# Patient Record
Sex: Female | Born: 1995 | Hispanic: No | Marital: Single | Smoking: Never smoker
Health system: Southern US, Community
[De-identification: ages and names within clinical notes are randomized; demographics above are authoritative.]

## PROBLEM LIST (undated history)

## (undated) DIAGNOSIS — L80 Vitiligo: Secondary | ICD-10-CM

## (undated) HISTORY — DX: Vitiligo: L80

---

## 2019-03-20 HISTORY — PX: CYST REMOVAL NECK: SHX6281

## 2021-03-19 NOTE — L&D Delivery Note (Signed)
OB/GYN Faculty Practice Delivery Note  Kimberly Horton is a 27 y.o. G1P0000 s/p SVD at [redacted]w[redacted]d. She was admitted for gHTN.   ROM: 11h 61m with clear fluid GBS Status:  Positive/-- (10/03 1636) Maximum Maternal Temperature:  Temp (24hrs), Avg:97.8 F (36.6 C), Min:97.4 F (36.3 C), Max:98.2 F (36.8 C)    Labor Progress: Patient arrived at 0 cm dilation and was induced with cytotec, pit, arom, FB.   Delivery Date/Time: 12/29/2021 at 0214 Delivery: Called to room and patient was complete and pushing. Head delivered in ROA position. Nuchal cord present and immediately reduced . Shoulder and body delivered in usual fashion. Infant with spontaneous cry, placed on mother's abdomen, dried and stimulated. Cord clamped x 2 after 1-minute delay, and cut by FOB. Cord blood drawn. Placenta delivered spontaneously with gentle cord traction. Fundus firm with massage and Pitocin. Labia, perineum, vagina, and cervix inspected with 1st degree perineal laceration and bilateral hemostatic periurethral lacerations.   Placenta: Spontaneous, intact, 3 vessel cord  Complications: none Lacerations: 1st degree perineal which extends into right labia repaired and bilateral hemostatic periurethral   EBL: 397 Analgesia: epidural    Infant: APGAR (1 MIN): 7   APGAR (5 MINS): 9   APGAR (10 MINS):    Weight: pending  Gifford Shave, MD  12/29/2021 3:14 AM

## 2021-05-29 ENCOUNTER — Other Ambulatory Visit: Payer: Self-pay

## 2021-05-29 ENCOUNTER — Ambulatory Visit (LOCAL_COMMUNITY_HEALTH_CENTER): Payer: Medicaid Other

## 2021-05-29 VITALS — BP 126/74 | Ht 71.0 in | Wt 282.0 lb

## 2021-05-29 DIAGNOSIS — Z3201 Encounter for pregnancy test, result positive: Secondary | ICD-10-CM

## 2021-05-29 LAB — PREGNANCY, URINE: Preg Test, Ur: POSITIVE — AB

## 2021-05-29 MED ORDER — PRENATAL 27-0.8 MG PO TABS: 1.0000 | ORAL_TABLET | Freq: Every day | ORAL | 0 refills | Status: AC

## 2021-05-29 NOTE — Progress Notes (Signed)
UPT positive. Plans prenatal care at [facility]. To clerk for preadmit. Kalii Chesmore, RN  

## 2021-06-07 ENCOUNTER — Encounter: Payer: Self-pay | Admitting: Nurse Practitioner

## 2021-06-07 ENCOUNTER — Ambulatory Visit: Payer: Medicaid Other | Admitting: Advanced Practice Midwife

## 2021-06-07 ENCOUNTER — Other Ambulatory Visit: Payer: Self-pay

## 2021-06-07 DIAGNOSIS — O99212 Obesity complicating pregnancy, second trimester: Secondary | ICD-10-CM

## 2021-06-07 DIAGNOSIS — Z3402 Encounter for supervision of normal first pregnancy, second trimester: Secondary | ICD-10-CM | POA: Diagnosis not present

## 2021-06-07 DIAGNOSIS — L80 Vitiligo: Secondary | ICD-10-CM

## 2021-06-07 DIAGNOSIS — O0930 Supervision of pregnancy with insufficient antenatal care, unspecified trimester: Secondary | ICD-10-CM | POA: Insufficient documentation

## 2021-06-07 DIAGNOSIS — O0932 Supervision of pregnancy with insufficient antenatal care, second trimester: Secondary | ICD-10-CM

## 2021-06-07 DIAGNOSIS — O99210 Obesity complicating pregnancy, unspecified trimester: Secondary | ICD-10-CM

## 2021-06-07 LAB — URINALYSIS
Bilirubin, UA: NEGATIVE
Glucose, UA: NEGATIVE
Ketones, UA: NEGATIVE
Leukocytes,UA: NEGATIVE
Nitrite, UA: NEGATIVE
Protein,UA: NEGATIVE
RBC, UA: NEGATIVE
Specific Gravity, UA: 1.025 (ref 1.005–1.030)
Urobilinogen, Ur: 0.2 mg/dL (ref 0.2–1.0)
pH, UA: 6 (ref 5.0–7.5)

## 2021-06-07 LAB — HEMOGLOBIN, FINGERSTICK: Hemoglobin: 12.8 g/dL (ref 11.1–15.9)

## 2021-06-07 LAB — WET PREP FOR TRICH, YEAST, CLUE
Trichomonas Exam: NEGATIVE
Yeast Exam: NEGATIVE

## 2021-06-07 NOTE — Progress Notes (Addendum)
Patient here for new OB visit at 15 6/7. Patient moved to the area from [facility] last month. Living with FOB and his cousin. She states she and her boyfriend are working and saving for their own apartment. Needs BMI labs today. Needs to be to lab by 2:50 for blood draw. Vaccine records copied and [facility] will be updated. Had Nexplanon removed 10/2020 and planning this pregnancy. States last Pap in [place] fall of 2022, ROI signed and faxed with confirmation. Wants to think about flu vaccine for next visit. Desires Quad screen today. Burt Knack, RN  ? ?Quad screen not done today, due to measurements that don't support current gestational age. Patient aware of situation and counseled that after U/S confirmation of dates, quad screen will be offered.Burt Knack, RN  ?

## 2021-06-07 NOTE — Progress Notes (Signed)
[facility] Department  ?Maternal Health Clinic ? ? ?INITIAL PRENATAL VISIT NOTE ? ?Subjective:  ?Kimberly Horton is a 26 y.o. SBF nonsmoker G1P0000 at [redacted]w[redacted]d being seen today to start prenatal care at the [facility] Department.  She feels "happy" about planned pregnancy when Nexplanon removed 10/2020.  25 yo FOB feels "super excited" about pregnancy; in supportive 1 1/2 year relationship with employed FOB who has no children. She moved here from [facility] 05/12/21 and living with FOB, his cousin and her husband live upstairs. She works 66 hrs/wk at [employer] with FOB. LMP 02/16/21.  Denies cigs, vaping, cigars. Last MJ 2019. Last ETOH 05/03/21 (1 mixed drink) q 2 mo.  Denies ER use or u/s this pregnancy.  She is currently monitored for the following issues for this high-risk pregnancy and has Obesity affecting pregnancy BMI=39.7; Late prenatal care @ 16 1/7; Encounter for supervision of normal first pregnancy in second trimester; and Vitiligo on their problem list. ? ?Patient reports no complaints.  Contractions: Not present. Vag. Bleeding: None.  Movement: Absent. Denies leaking of fluid.  ? ?Indications for ASA therapy (per uptodate) ?One of the following: ?Previous pregnancy with preeclampsia, especially early onset and with an adverse outcome No ?Multifetal gestation No ?Chronic hypertension No ?Type 1 or 2 diabetes mellitus No ?Chronic kidney disease No ?Autoimmune disease (antiphospholipid syndrome, systemic lupus erythematosus) No ? ?Two or more of the following: ?Nulliparity Yes ?Obesity (body mass index >30 kg/m2) Yes ?Family history of preeclampsia in mother or sister No ?Age ?35 years No ?Sociodemographic characteristics (African American race, low socioeconomic level) Yes ?Personal risk factors (eg, previous pregnancy with low birth weight or small for gestational age infant, previous adverse pregnancy outcome [eg, stillbirth], interval >10 years between pregnancies) No ? ? ?The following  portions of the patient's history were reviewed and updated as appropriate: allergies, current medications, past family history, past medical history, past social history, past surgical history and problem list. Problem list updated. ? ?Objective:  ? ?Vitals:  ? 06/07/21 1336  ?BP: 116/78  ?Pulse: 94  ?Temp: 97.7 ?F (36.5 ?C)  ?Weight: 277 lb (125.6 kg)  ? ? ?Fetal Status: Fetal Heart Rate (bpm): not heard Fundal Height: 13 cm Movement: Absent  Presentation: Undeterminable ? ? ?Physical Exam ?Vitals and nursing note reviewed.  ?Constitutional:   ?   General: She is not in acute distress. ?   Appearance: Normal appearance. She is well-developed. She is obese.  ?HENT:  ?   Head: Normocephalic and atraumatic.  ?   Right Ear: External ear normal.  ?   Left Ear: External ear normal.  ?   Nose: Nose normal. No congestion or rhinorrhea.  ?   Mouth/Throat:  ?   Lips: Pink.  ?   Mouth: Mucous membranes are moist.  ?   Dentition: Normal dentition. No dental caries.  ?   Pharynx: Oropharynx is clear. Uvula midline.  ?   Comments: Dentition: good, last dental exam 2022 ?Eyes:  ?   General: No scleral icterus. ?   Conjunctiva/sclera: Conjunctivae normal.  ?Neck:  ?   Thyroid: No thyroid mass or thyromegaly.  ?Cardiovascular:  ?   Rate and Rhythm: Normal rate.  ?   Pulses: Normal pulses.  ?   Comments: Extremities are warm and well perfused ?Pulmonary:  ?   Effort: Pulmonary effort is normal.  ?   Breath sounds: Normal breath sounds.  ?Chest:  ?   Chest wall: No mass.  ?Breasts: ?  Tanner Score is 5.  ?   Breasts are symmetrical.  ?   Right: Normal. No mass, nipple discharge or skin change.  ?   Left: Normal. No mass, nipple discharge or skin change.  ?Abdominal:  ?   Palpations: Abdomen is soft.  ?   Tenderness: There is no abdominal tenderness.  ?   Comments: Gravid, soft without masses or tenderness, increased adipose, fundal height=13 wks size, no FHR heard  ?Genitourinary: ?   General: Normal vulva.  ?   Exam position:  Lithotomy position.  ?   Pubic Area: No rash.   ?   Labia:     ?   Right: No rash.     ?   Left: No rash.   ?   Vagina: Vaginal discharge (white creamy luekorrhea, ph<4.5) present.  ?   Cervix: Normal.  ?   Uterus: Normal. Enlarged (Gravid 13 wk size but difficult to assess due to increased adipose tissue). Not tender.   ?   Rectum: Normal. No external hemorrhoid.  ?Musculoskeletal:  ?   Right lower leg: No edema.  ?   Left lower leg: No edema.  ?Lymphadenopathy:  ?   Cervical: No cervical adenopathy.  ?   Upper Body:  ?   Right upper body: No axillary adenopathy.  ?   Left upper body: No axillary adenopathy.  ?Skin: ?   General: Skin is warm.  ?   Capillary Refill: Capillary refill takes less than 2 seconds.  ?Neurological:  ?   Mental Status: She is alert.  ? ? ?Assessment and Plan:  ?Pregnancy: G1P0000 at [redacted]w[redacted]d ? ?1. Obesity affecting pregnancy, antepartum ?Too late in care to initiate ASA 81 mg daily ? ?2. Late prenatal care @ 16 1/7 ? ? ?3. Encounter for supervision of normal first pregnancy in second trimester ?Counseled on weight gain of 11-20 lbs this pregnancy ?U/s ordered for viability ?Pt wants Quad screen but S<D so will wait until viability/dating u/s done for accuracy ?Early glucola today ?Please give dental list to pt ?- Hgb A1c w/o eAG ?- TSH + free T4 ?- [id number] Drug Screen ?- Protein / creatinine ratio, urine  (Spot) ?- Hemoglobin, venipuncture ?- Urinalysis (Urine Dip) ?- WET PREP FOR TRICH, YEAST, CLUE ?- Comprehensive metabolic panel ?- Glucose tolerance, 1 hour ?- Chlamydia/GC NAA, Confirmation ?- Hgb Fractionation Cascade ?- HCV Ab w Reflex to Quant PCR ?- HIV-1/HIV-2 Qualitative RNA ?- Urine Culture ?- Prenatal profile without Varicella or Rubella ?- Pap IG (Image Guided) ?- [place] MFM OB COMPLETE LESS THAN 14 WEEKS; Future ? ?4. Vitiligo ? ? ? ? ?Discussed overview of care and coordination with inpatient delivery practices including [facility], [facility], [facility] and [facility].  ? ?Reviewed  Centering pregnancy as standard of care at [facility] ? ? ?Preterm labor symptoms and general obstetric precautions including but not limited to vaginal bleeding, contractions, leaking of fluid and fetal movement were reviewed in detail with the patient. ? ?Please refer to After Visit Summary for other counseling recommendations.  ? ?No follow-ups on file. ? ?No future appointments. ? ?Herbie Saxon, CNM ? ?

## 2021-06-07 NOTE — Progress Notes (Signed)
In house labs reviewed with provider, E. Sciora, no treatment indicated.Burt Knack, RN  ?

## 2021-06-08 ENCOUNTER — Other Ambulatory Visit: Payer: Self-pay | Admitting: Advanced Practice Midwife

## 2021-06-08 DIAGNOSIS — Z3402 Encounter for supervision of normal first pregnancy, second trimester: Secondary | ICD-10-CM

## 2021-06-08 LAB — TSH+FREE T4
Free T4: 1.15 ng/dL (ref 0.82–1.77)
TSH: 1.63 u[IU]/mL (ref 0.450–4.500)

## 2021-06-08 LAB — 789231 7+OXYCODONE-BUND
Amphetamines, Urine: NEGATIVE ng/mL
BENZODIAZ UR QL: NEGATIVE ng/mL
Barbiturate screen, urine: NEGATIVE ng/mL
Cannabinoid Quant, Ur: NEGATIVE ng/mL
Cocaine (Metab.): NEGATIVE ng/mL
OPIATE SCREEN URINE: NEGATIVE ng/mL
Oxycodone/Oxymorphone, Urine: NEGATIVE ng/mL
PCP Quant, Ur: NEGATIVE ng/mL

## 2021-06-08 LAB — CBC/D/PLT+RPR+RH+ABO+AB SCR
Antibody Screen: NEGATIVE
Basophils Absolute: 0.1 10*3/uL (ref 0.0–0.2)
Basos: 1 %
EOS (ABSOLUTE): 0.3 10*3/uL (ref 0.0–0.4)
Eos: 2 %
Hematocrit: 37.9 % (ref 34.0–46.6)
Hemoglobin: 12.5 g/dL (ref 11.1–15.9)
Hepatitis B Surface Ag: NEGATIVE
Immature Grans (Abs): 0 10*3/uL (ref 0.0–0.1)
Immature Granulocytes: 0 %
Lymphocytes Absolute: 2.9 10*3/uL (ref 0.7–3.1)
Lymphs: 27 %
MCH: 28.9 pg (ref 26.6–33.0)
MCHC: 33 g/dL (ref 31.5–35.7)
MCV: 88 fL (ref 79–97)
Monocytes Absolute: 0.6 10*3/uL (ref 0.1–0.9)
Monocytes: 6 %
Neutrophils Absolute: 6.7 10*3/uL (ref 1.4–7.0)
Neutrophils: 64 %
Platelets: 407 10*3/uL (ref 150–450)
RBC: 4.33 x10E6/uL (ref 3.77–5.28)
RDW: 13.9 % (ref 11.7–15.4)
RPR Ser Ql: NONREACTIVE
Rh Factor: POSITIVE
WBC: 10.6 10*3/uL (ref 3.4–10.8)

## 2021-06-08 LAB — HCV INTERPRETATION

## 2021-06-08 LAB — PROTEIN / CREATININE RATIO, URINE
Creatinine, Urine: 96.4 mg/dL
Protein, Ur: 5.2 mg/dL
Protein/Creat Ratio: 54 mg/g creat (ref 0–200)

## 2021-06-08 LAB — HGB A1C W/O EAG: Hgb A1c MFr Bld: 5.4 % (ref 4.8–5.6)

## 2021-06-08 LAB — HCV AB W REFLEX TO QUANT PCR: HCV Ab: NONREACTIVE

## 2021-06-09 ENCOUNTER — Other Ambulatory Visit: Payer: Self-pay | Admitting: Family Medicine

## 2021-06-09 ENCOUNTER — Telehealth: Payer: Self-pay

## 2021-06-09 DIAGNOSIS — O0930 Supervision of pregnancy with insufficient antenatal care, unspecified trimester: Secondary | ICD-10-CM

## 2021-06-09 DIAGNOSIS — O99211 Obesity complicating pregnancy, first trimester: Secondary | ICD-10-CM

## 2021-06-09 DIAGNOSIS — Z3402 Encounter for supervision of normal first pregnancy, second trimester: Secondary | ICD-10-CM

## 2021-06-09 LAB — CHLAMYDIA/GC NAA, CONFIRMATION
Chlamydia trachomatis, NAA: NEGATIVE
Neisseria gonorrhoeae, NAA: NEGATIVE

## 2021-06-09 LAB — URINE CULTURE

## 2021-06-09 NOTE — Telephone Encounter (Signed)
TC to patient to inform of 06/14/21 U/S at [facility] in [place]. Patient needs to arrive by 0745. Patient states she has seen the appointment with address in MyChart. Patient states understanding of where to go and when. Denies questions at this time.Burt Knack, RN  ?

## 2021-06-09 NOTE — Progress Notes (Signed)
Talked to [facility] and then Dr. Tama High.  Approved for overbook ? ? ?Caren Macadam, MD, MPH, ABFM ?Medical Director  ?[place] Department ? ?

## 2021-06-11 LAB — PAP IG (IMAGE GUIDED): PAP Smear Comment: 0

## 2021-06-12 LAB — COMPREHENSIVE METABOLIC PANEL
ALT: 13 IU/L (ref 0–32)
AST: 15 IU/L (ref 0–40)
Albumin/Globulin Ratio: 1.4 (ref 1.2–2.2)
Albumin: 4.1 g/dL (ref 3.9–5.0)
Alkaline Phosphatase: 60 IU/L (ref 44–121)
BUN/Creatinine Ratio: 18 (ref 9–23)
BUN: 11 mg/dL (ref 6–20)
Bilirubin Total: <0.2 mg/dL (ref 0.0–1.2)
CO2: 19 mmol/L — ABNORMAL LOW (ref 20–29)
Calcium: 9.2 mg/dL (ref 8.7–10.2)
Chloride: 102 mmol/L (ref 96–106)
Creatinine, Ser: 0.62 mg/dL (ref 0.57–1.00)
Globulin, Total: 3 g/dL (ref 1.5–4.5)
Glucose: 85 mg/dL (ref 70–99)
Potassium: 4.2 mmol/L (ref 3.5–5.2)
Sodium: 134 mmol/L (ref 134–144)
Total Protein: 7.1 g/dL (ref 6.0–8.5)
eGFR: 127 mL/min/{1.73_m2} (ref >59)

## 2021-06-12 LAB — HGB FRACTIONATION CASCADE
Hgb A2: 2.5 % (ref 1.8–3.2)
Hgb A: 97.5 % (ref 96.4–98.8)
Hgb F: 0 % (ref 0.0–2.0)
Hgb S: 0 %

## 2021-06-12 LAB — HIV-1/HIV-2 QUALITATIVE RNA
HIV-1 RNA, Qualitative: NONREACTIVE
HIV-2 RNA, Qualitative: NONREACTIVE

## 2021-06-12 LAB — GLUCOSE, 1 HOUR GESTATIONAL: Gestational Diabetes Screen: 93 mg/dL (ref 70–139)

## 2021-06-13 ENCOUNTER — Other Ambulatory Visit: Payer: Self-pay

## 2021-06-13 ENCOUNTER — Other Ambulatory Visit: Payer: Self-pay | Admitting: *Deleted

## 2021-06-13 ENCOUNTER — Other Ambulatory Visit (HOSPITAL_COMMUNITY): Payer: Self-pay

## 2021-06-13 ENCOUNTER — Other Ambulatory Visit: Payer: Self-pay | Admitting: Family Medicine

## 2021-06-13 ENCOUNTER — Ambulatory Visit: Payer: Medicaid Other | Attending: Family Medicine

## 2021-06-13 DIAGNOSIS — O0930 Supervision of pregnancy with insufficient antenatal care, unspecified trimester: Secondary | ICD-10-CM

## 2021-06-13 DIAGNOSIS — Z3A09 9 weeks gestation of pregnancy: Secondary | ICD-10-CM | POA: Insufficient documentation

## 2021-06-13 DIAGNOSIS — O99211 Obesity complicating pregnancy, first trimester: Secondary | ICD-10-CM | POA: Diagnosis not present

## 2021-06-13 DIAGNOSIS — O26841 Uterine size-date discrepancy, first trimester: Secondary | ICD-10-CM | POA: Diagnosis present

## 2021-06-13 DIAGNOSIS — Z3402 Encounter for supervision of normal first pregnancy, second trimester: Secondary | ICD-10-CM | POA: Insufficient documentation

## 2021-06-13 DIAGNOSIS — Z363 Encounter for antenatal screening for malformations: Secondary | ICD-10-CM

## 2021-06-14 ENCOUNTER — Encounter: Payer: Self-pay | Admitting: Family Medicine

## 2021-06-14 ENCOUNTER — Ambulatory Visit: Payer: Self-pay

## 2021-06-28 NOTE — Addendum Note (Signed)
Addended by: Estiven Kohan on: 06/28/2021 10:37 AM ? ? Modules accepted: Orders ? ?

## 2021-07-03 ENCOUNTER — Encounter: Payer: Self-pay | Admitting: Nurse Practitioner

## 2021-07-03 ENCOUNTER — Ambulatory Visit: Payer: Medicaid Other | Admitting: Nurse Practitioner

## 2021-07-03 VITALS — BP 123/83 | HR 109 | Temp 97.9°F | Wt 273.6 lb

## 2021-07-03 DIAGNOSIS — O99212 Obesity complicating pregnancy, second trimester: Secondary | ICD-10-CM

## 2021-07-03 DIAGNOSIS — Z3402 Encounter for supervision of normal first pregnancy, second trimester: Secondary | ICD-10-CM

## 2021-07-03 DIAGNOSIS — O99210 Obesity complicating pregnancy, unspecified trimester: Secondary | ICD-10-CM

## 2021-07-03 NOTE — Progress Notes (Signed)
[facility] Department ?Maternal Health Clinic ? ?PRENATAL VISIT NOTE ? ?Subjective:  ?Kimberly Horton is a 26 y.o. G1P0000 at [redacted]w[redacted]d being seen today for ongoing prenatal care.  She is currently monitored for the following issues for this high-risk pregnancy and has Obesity affecting pregnancy BMI=39.7; Late prenatal care @ 16 1/7; Encounter for supervision of normal first pregnancy in second trimester; and Vitiligo at age 78 on their problem list. ? ?Patient reports  back pain, sharp pains to right buttocks, and some slight cramping in lower abdomen .  Contractions: Not present. Vag. Bleeding: None.  Movement: Absent. Denies leaking of fluid/ROM.  ? ?The following portions of the patient's history were reviewed and updated as appropriate: allergies, current medications, past family history, past medical history, past social history, past surgical history and problem list. Problem list updated. ? ?Objective:  ? ?Vitals:  ? 07/03/21 0835 07/03/21 0850  ?BP: 126/80 123/83  ?Pulse: (!) 110 (!) 109  ?Temp: 97.9 ?F (36.6 ?C)   ?Weight: 273 lb 9.6 oz (124.1 kg)   ? ? ?Fetal Status: Fetal Heart Rate (bpm): not heard  Fundal Height: 11 cm Movement: Absent    ? ?General:  Alert, oriented and cooperative. Patient is in no acute distress.  ?Skin: Skin is warm and dry. No rash noted.   ?Cardiovascular: Normal heart rate noted  ?Respiratory: Normal respiratory effort, no problems with respiration noted  ?Abdomen: Soft, gravid, appropriate for gestational age.  Pain/Pressure: Present     ?Pelvic: Cervical exam performed        ?Extremities: Normal range of motion.  Edema: None  ?Mental Status: Normal mood and affect. Normal behavior. Normal judgment and thought content.  ? ?Assessment and Plan:  ?Pregnancy: G1P0000 at [redacted]w[redacted]d ? ?1. Encounter for supervision of normal first pregnancy in second trimester ?-26 year old female in clinic today for prenatal visit. ?-Patient taking PNV daily.  ?-Patient reports some sharp pain  in buttocks, slight cramping in lower abdomen, and lower back pain that is not consistent.  Denies urgency, frequency when she urinates, and cloudy or foul smell odor.  Denies vaginal bleeding.  Will continue to monitor.  Patient given stretching exercises to assist with nerve pain in buttocks and lower back pain.   ?-Today patient is [redacted]w[redacted]d.  No fetal heart tones heard today.  Patient had U/S on 06/13/21 ([redacted]w[redacted]d) that showed good fetal movement and heart tones.  CRL 24.1 mm, EDD 01/16/2021.  Due to obesity, recommended for patient to have an anatomy scan at 19 weeks.  Will hopefully hear fetal heart tones at next visit.   ? ?2. Obesity affecting pregnancy, antepartum ?-Patient had U/S on 06/13/21 that changed dating and gestation age.  Patient today is [redacted]w[redacted]d.  Patient advised to take ASA daily.  Patient also encouraged frequent exercise and limiting fatty foods and limiting sugars.   ?--11 lb 6.4 oz (-5.171 kg)  ? ? ?Term labor symptoms and general obstetric precautions including but not limited to vaginal bleeding, contractions, leaking of fluid and fetal movement were reviewed in detail with the patient. ?Please refer to After Visit Summary for other counseling recommendations.  ? ?Return in about 4 weeks (around 07/31/2021) for Routine prenatal care visit. ? ?Future Appointments  ?Date Time Provider [place]  ?08/01/2021  8:40 AM [facility] PROVIDER [facility] None  ?08/22/2021  7:30 AM [facility] NURSE [facility]  ?08/22/2021  7:45 AM [facility] US4 [facility]  ? ? ?Gregary Cromer, FNP ? ?

## 2021-07-03 NOTE — Progress Notes (Signed)
Patient here for MH RV at 11 6/7. Due date changed due to U/S. Aware of anatomy U/S on 08/22/21. Covid card copied. Declines flu vaccine..Ms..Kathie Posa Babs Sciara, RN  ? ?

## 2021-08-01 ENCOUNTER — Ambulatory Visit: Payer: Medicaid Other | Admitting: Family Medicine

## 2021-08-01 VITALS — BP 120/73 | HR 111 | Temp 97.3°F | Wt 277.4 lb

## 2021-08-01 DIAGNOSIS — Z3402 Encounter for supervision of normal first pregnancy, second trimester: Secondary | ICD-10-CM

## 2021-08-01 DIAGNOSIS — O99212 Obesity complicating pregnancy, second trimester: Secondary | ICD-10-CM

## 2021-08-01 NOTE — Progress Notes (Signed)
Client aware of 08/22/2021 [place] appt at [facility] in [place]. Desires MaterniT21 today and AFP only. Client has not taken Aspirin this pregnancy. Jossie Ng, RN ? ?

## 2021-08-01 NOTE — Progress Notes (Signed)
[facility] Department ?Maternal Health Clinic ? ?PRENATAL VISIT NOTE ? ?Subjective:  ?Kimberly Horton is a 25 y.o. G1P0000 at [redacted]w[redacted]d being seen today for ongoing prenatal care.  She is currently monitored for the following issues for this high-risk pregnancy and has Obesity affecting pregnancy BMI=39.7; Late prenatal care @ 16 1/7; Encounter for supervision of normal first pregnancy in second trimester; and Vitiligo at age 21 on their problem list. ? ?Patient reports backache and flank pain  .  Contractions: Not present. Vag. Bleeding: None.  Movement: Absent. Denies leaking of fluid/ROM.  ? ?The following portions of the patient's history were reviewed and updated as appropriate: allergies, current medications, past family history, past medical history, past social history, past surgical history and problem list. Problem list updated. ? ?Objective:  ? ?Vitals:  ? 08/01/21 0841  ?BP: 120/73  ?Pulse: (!) 111  ?Temp: (!) 97.3 ?F (36.3 ?C)  ?Weight: 277 lb 6.4 oz (125.8 kg)  ? ? ?Fetal Status: Fetal Heart Rate (bpm): 159 Fundal Height: 16 cm Movement: Absent    ? ?General:  Alert, oriented and cooperative. Patient is in no acute distress.  ?Skin: Skin is warm and dry. No rash noted.   ?Cardiovascular: Normal heart rate noted  ?Respiratory: Normal respiratory effort, no problems with respiration noted  ?Abdomen: Soft, gravid, appropriate for gestational age.  Pain/Pressure: Present     ?Pelvic: Cervical exam deferred        ?Extremities: Normal range of motion.  Edema: None  ?Mental Status: Normal mood and affect. Normal behavior. Normal judgment and thought content.  ? ?Assessment and Plan:  ?Pregnancy: G1P0000 at [redacted]w[redacted]d ? ?1. Encounter for supervision of normal first pregnancy in second trimester ?Taking PNV as directed  ?Not taking ASA as directed,  "mother told her  no one else she knew that had a baby in the last year was taking ASA.  Encourage pt to take ASA d/t obesity and to help prevent pre-eclampsia.   Information sheet given.   ?FHT heard today 159  ?Reports cramping discussed ligament pain.   ?Aware of anatomy US appointment on 08/22/21 @ 7:30  ? ? ? ?2. Obesity affecting pregnancy in second trimester ?TWG-  ?Discussed diet and exercise - encouraged to have protein at every meal and to do moderate walking to increase heart rate ~ 3-4 times per week.  ?Assessed previous back pain -reports sciatica pain is better, encouraged stretching exercises for sciatica  ? ? ?Preterm labor symptoms and general obstetric precautions including but not limited to vaginal bleeding, contractions, leaking of fluid and fetal movement were reviewed in detail with the patient. ?Please refer to After Visit Summary for other counseling recommendations.  ?No follow-ups on file. ? ?Future Appointments  ?Date Time Provider [place]  ?08/22/2021  7:30 AM [facility] NURSE [facility]  ?08/22/2021  7:45 AM [facility] US4 [facility]  ? ? ?Junious Dresser, FNP ?

## 2021-08-03 LAB — AFP, SERUM, OPEN SPINA BIFIDA
AFP MoM: 1.2
AFP Value: 29.1 ng/mL
Gest. Age on Collection Date: 16 weeks
Maternal Age At EDD: 24.8 yr
OSBR Risk 1 IN: 6499
Test Results:: NEGATIVE
Weight: 277 [lb_av]

## 2021-08-10 LAB — MATERNIT 21 PLUS CORE, BLOOD
Fetal Fraction: 7
Result (T21): NEGATIVE
Trisomy 13 (Patau syndrome): NEGATIVE
Trisomy 18 (Edwards syndrome): NEGATIVE
Trisomy 21 (Down syndrome): NEGATIVE

## 2021-08-22 ENCOUNTER — Ambulatory Visit: Payer: Medicaid Other | Attending: Obstetrics

## 2021-08-22 ENCOUNTER — Ambulatory Visit: Payer: Medicaid Other | Admitting: *Deleted

## 2021-08-22 ENCOUNTER — Other Ambulatory Visit: Payer: Self-pay | Admitting: *Deleted

## 2021-08-22 ENCOUNTER — Encounter: Payer: Self-pay | Admitting: *Deleted

## 2021-08-22 VITALS — BP 125/83 | HR 116

## 2021-08-22 DIAGNOSIS — O99212 Obesity complicating pregnancy, second trimester: Secondary | ICD-10-CM

## 2021-08-22 DIAGNOSIS — Z3A19 19 weeks gestation of pregnancy: Secondary | ICD-10-CM | POA: Diagnosis not present

## 2021-08-22 DIAGNOSIS — E669 Obesity, unspecified: Secondary | ICD-10-CM

## 2021-08-22 DIAGNOSIS — Z362 Encounter for other antenatal screening follow-up: Secondary | ICD-10-CM

## 2021-08-22 DIAGNOSIS — Z363 Encounter for antenatal screening for malformations: Secondary | ICD-10-CM | POA: Insufficient documentation

## 2021-08-29 ENCOUNTER — Ambulatory Visit: Payer: Medicaid Other | Admitting: Advanced Practice Midwife

## 2021-08-29 VITALS — BP 125/81 | HR 135 | Temp 97.9°F | Wt 283.6 lb

## 2021-08-29 DIAGNOSIS — O0932 Supervision of pregnancy with insufficient antenatal care, second trimester: Secondary | ICD-10-CM

## 2021-08-29 DIAGNOSIS — Z3402 Encounter for supervision of normal first pregnancy, second trimester: Secondary | ICD-10-CM

## 2021-08-29 DIAGNOSIS — O99212 Obesity complicating pregnancy, second trimester: Secondary | ICD-10-CM

## 2021-08-29 DIAGNOSIS — O0930 Supervision of pregnancy with insufficient antenatal care, unspecified trimester: Secondary | ICD-10-CM

## 2021-08-29 DIAGNOSIS — Z8659 Personal history of other mental and behavioral disorders: Secondary | ICD-10-CM

## 2021-08-29 NOTE — Progress Notes (Signed)
[facility] Health Department Maternal Health Clinic  PRENATAL VISIT NOTE  Subjective:  Kimberly Horton is a 26 y.o. G1P0000 at [redacted]w[redacted]d being seen today for ongoing prenatal care.  She is currently monitored for the following issues for this low-risk pregnancy and has Obesity affecting pregnancy BMI=39.7; Late prenatal care @ 16 1/7; Encounter for supervision of normal first pregnancy in second trimester; and Vitiligo at age 54 on their problem list.  Patient reports no complaints.  Contractions: Not present. Vag. Bleeding: None.  Movement: Present. Denies leaking of fluid/ROM.   The following portions of the patient's history were reviewed and updated as appropriate: allergies, current medications, past family history, past medical history, past social history, past surgical history and problem list. Problem list updated.  Objective:   Vitals:   08/29/21 1003 08/29/21 1017  BP: 125/81   Pulse: (!) 149 (!) 135  Temp: 97.9 F (36.6 C)   Weight: 283 lb 9.6 oz (128.6 kg)     Fetal Status: Fetal Heart Rate (bpm): 140 Fundal Height: 29 cm Movement: Present     General:  Alert, oriented and cooperative. Patient is in no acute distress.  Skin: Skin is warm and dry. No rash noted.   Cardiovascular: Normal heart rate noted  Respiratory: Normal respiratory effort, no problems with respiration noted  Abdomen: Soft, gravid, appropriate for gestational age.  Pain/Pressure: Absent     Pelvic: Cervical exam deferred        Extremities: Normal range of motion.  Edema: None  Mental Status: Normal mood and affect. Normal behavior. Normal judgment and thought content.   Assessment and Plan:  Pregnancy: G1P0000 at [redacted]w[redacted]d  1. Obesity affecting pregnancy in second trimester -1 lb 6.4 oz (-0.635 kg) 6 lb wt gain in past 4 wks Not exercising--encouraged to do so Began taking ASA at 16 wks--discussion with pt that too late for any benefits to be derived  2. Late prenatal care @ 16 1/7   3.  Encounter for supervision of normal first pregnancy in second trimester Reviewed 08/22/21 u/s at 19 2/7 with posterior placenta, AFI wnl, EFW=44%, 3VC, anatomy wnl Has f/u u/s 09/20/21 Gender reveal party in 5 days P=149 and 135 on repeat. Pt states she feels sl out of breath since coming off elevator, denies caffeine or anxiety. Hx panic attacks age 75. BP 125/81, O2 sat=99% Pulse has been trending upwards this pregnancy If Pulse still elevated at next apt may need referral to MFM, cardio [employer] 40 hrs/wk and living with FOB (his family lives upstairs)   Preterm labor symptoms and general obstetric precautions including but not limited to vaginal bleeding, contractions, leaking of fluid and fetal movement were reviewed in detail with the patient. Please refer to After Visit Summary for other counseling recommendations.  No follow-ups on file.  Future Appointments  Date Time Provider Department Center  09/20/2021  3:30 PM [facility] NURSE [facility]  09/20/2021  3:45 PM [facility] US5 [facility]    Alberteen Spindle, CNM

## 2021-08-29 NOTE — Progress Notes (Signed)
Here today for 20. Week MH RV. Taking ASA and PNV QD. Denies ED/hospital visits since last RV. O2 sats 99%. Hal Morales, RN

## 2021-09-08 ENCOUNTER — Telehealth: Payer: Self-pay | Admitting: Family Medicine

## 2021-09-08 ENCOUNTER — Observation Stay
Admission: EM | Admit: 2021-09-08 | Discharge: 2021-09-08 | Disposition: A | Discharge status: Home / Self Care | Payer: Medicaid Other | Attending: Advanced Practice Midwife | Admitting: Advanced Practice Midwife

## 2021-09-08 ENCOUNTER — Other Ambulatory Visit: Payer: Self-pay

## 2021-09-08 ENCOUNTER — Encounter: Payer: Self-pay | Admitting: Obstetrics and Gynecology

## 2021-09-08 DIAGNOSIS — R1031 Right lower quadrant pain: Secondary | ICD-10-CM | POA: Diagnosis not present

## 2021-09-08 DIAGNOSIS — R1032 Left lower quadrant pain: Secondary | ICD-10-CM | POA: Diagnosis not present

## 2021-09-08 DIAGNOSIS — Z3A21 21 weeks gestation of pregnancy: Secondary | ICD-10-CM | POA: Diagnosis not present

## 2021-09-08 DIAGNOSIS — R102 Pelvic and perineal pain: Secondary | ICD-10-CM | POA: Diagnosis not present

## 2021-09-08 DIAGNOSIS — Z7982 Long term (current) use of aspirin: Secondary | ICD-10-CM | POA: Diagnosis not present

## 2021-09-08 DIAGNOSIS — O26892 Other specified pregnancy related conditions, second trimester: Principal | ICD-10-CM | POA: Insufficient documentation

## 2021-09-08 DIAGNOSIS — Z79899 Other long term (current) drug therapy: Secondary | ICD-10-CM | POA: Insufficient documentation

## 2021-09-08 NOTE — Telephone Encounter (Signed)
Pt states that she would like to speak someone in MH to ask some questions about pain that she is having in her pelvic region.

## 2021-09-20 ENCOUNTER — Ambulatory Visit: Payer: Medicaid Other | Attending: Maternal & Fetal Medicine

## 2021-09-20 ENCOUNTER — Ambulatory Visit: Payer: Medicaid Other | Admitting: *Deleted

## 2021-09-20 VITALS — BP 123/74 | HR 93

## 2021-09-20 DIAGNOSIS — Z3A32 32 weeks gestation of pregnancy: Secondary | ICD-10-CM

## 2021-09-20 DIAGNOSIS — O99212 Obesity complicating pregnancy, second trimester: Secondary | ICD-10-CM

## 2021-09-20 DIAGNOSIS — E669 Obesity, unspecified: Secondary | ICD-10-CM | POA: Diagnosis not present

## 2021-09-21 ENCOUNTER — Other Ambulatory Visit: Payer: Self-pay | Admitting: *Deleted

## 2021-09-21 DIAGNOSIS — Z6841 Body Mass Index (BMI) 40.0 and over, adult: Secondary | ICD-10-CM

## 2021-09-21 DIAGNOSIS — O0932 Supervision of pregnancy with insufficient antenatal care, second trimester: Secondary | ICD-10-CM

## 2021-09-26 ENCOUNTER — Telehealth: Payer: Self-pay

## 2021-09-26 ENCOUNTER — Ambulatory Visit: Payer: Medicaid Other | Admitting: Advanced Practice Midwife

## 2021-09-26 VITALS — BP 119/74 | HR 121 | Temp 97.1°F | Wt 287.0 lb

## 2021-09-26 DIAGNOSIS — O0930 Supervision of pregnancy with insufficient antenatal care, unspecified trimester: Secondary | ICD-10-CM

## 2021-09-26 DIAGNOSIS — O99212 Obesity complicating pregnancy, second trimester: Secondary | ICD-10-CM

## 2021-09-26 DIAGNOSIS — Z3402 Encounter for supervision of normal first pregnancy, second trimester: Secondary | ICD-10-CM

## 2021-09-26 DIAGNOSIS — O0932 Supervision of pregnancy with insufficient antenatal care, second trimester: Secondary | ICD-10-CM

## 2021-09-26 LAB — HEMOGLOBIN, FINGERSTICK: Hemoglobin: 12.5 g/dL (ref 11.1–15.9)

## 2021-09-26 NOTE — Progress Notes (Signed)
Patient here for MH RV at 24w 0d.   Patient education given regarding PTL, handout given and questions answered.   [facility] high risk forms filled out.   Patient aware of upcoming appt.   Rx prescription for PNV given to patient from provider - RN made copy of Rx.   Earlyne Iba, RN

## 2021-09-26 NOTE — Progress Notes (Signed)
Hgb reviewed during clinic - no treatment indicated.   Referral for [facility]FM regarding continuous elevated HR faxed with confirmation 09/26/21.  Patient called and she is aware that she will receive phone call with appointment.   Earlyne Iba, RN

## 2021-09-26 NOTE — Telephone Encounter (Signed)
Call to [facility] [place] scheduler and left message regarding MFM consult appt for client. Number to call provided. (MFM consult referral faxed 09/26/2021). Jossie Ng, RN

## 2021-09-26 NOTE — Progress Notes (Signed)
[facility] Health Department Maternal Health Clinic  PRENATAL VISIT NOTE  Subjective:  Kimberly Horton is a 26 y.o. G1P0000 at [redacted]w[redacted]d being seen today for ongoing prenatal care.  She is currently monitored for the following issues for this low-risk pregnancy and has Obesity affecting pregnancy BMI=39.7; Late prenatal care @ 16 1/7; Encounter for supervision of normal first pregnancy in second trimester; Vitiligo at age 67; and History of panic attacks age 34 on their problem list.  Patient reports  increased pulse continuing with SOB .  Contractions: Not present. Vag. Bleeding: None.  Movement: Present. Denies leaking of fluid/ROM.   The following portions of the patient's history were reviewed and updated as appropriate: allergies, current medications, past family history, past medical history, past social history, past surgical history and problem list. Problem list updated.  Objective:   Vitals:   09/26/21 0819  BP: 119/74  Pulse: (!) 121  Temp: (!) 97.1 F (36.2 C)  Weight: 287 lb (130.2 kg)    Fetal Status: Fetal Heart Rate (bpm): 160 Fundal Height: 26 cm Movement: Present     General:  Alert, oriented and cooperative. Patient is in no acute distress.  Skin: Skin is warm and dry. No rash noted.   Cardiovascular: Normal heart rate noted  Respiratory: Normal respiratory effort, no problems with respiration noted  Abdomen: Soft, gravid, appropriate for gestational age.  Pain/Pressure: Absent     Pelvic: Cervical exam deferred        Extremities: Normal range of motion.  Edema: None  Mental Status: Normal mood and affect. Normal behavior. Normal judgment and thought content.   Assessment and Plan:  Pregnancy: G1P0000 at [redacted]w[redacted]d  1. Encounter for supervision of normal first pregnancy in second trimester MFM referral made for elevated Pulse 149, 135, on 08/29/21 and 121 today as well as SOB  Working 40 hrs/wk Here with FOB Reviewed 09/20/21 u/s at 23 5/7 with AFI  wnl, EFW=65%, 3VC, growth wnl; recommend monthly growth u/s due to obesity Next u/s 10/18/21 Popped blood vessel in left eye yesterday without etiology  - Hemoglobin, fingerstick  2. Obesity affecting pregnancy in second trimester 2 lb (0.907 kg) Not exercising Not taking ASA daily as late entry to care  3. Late prenatal care @ 16 1/7    Preterm labor symptoms and general obstetric precautions including but not limited to vaginal bleeding, contractions, leaking of fluid and fetal movement were reviewed in detail with the patient. Please refer to After Visit Summary for other counseling recommendations.  No follow-ups on file.  Future Appointments  Date Time Provider Department Center  10/18/2021  3:15 PM [facility] NURSE [facility]  10/18/2021  3:30 PM [facility] US3 [facility]    Alberteen Spindle, CNM

## 2021-09-27 ENCOUNTER — Other Ambulatory Visit: Payer: Self-pay

## 2021-09-27 ENCOUNTER — Emergency Department: Payer: Medicaid Other

## 2021-09-27 ENCOUNTER — Encounter: Payer: Self-pay | Admitting: Advanced Practice Midwife

## 2021-09-27 ENCOUNTER — Emergency Department
Admission: EM | Admit: 2021-09-27 | Discharge: 2021-09-27 | Disposition: A | Discharge status: Home / Self Care | Payer: Medicaid Other | Attending: Emergency Medicine | Admitting: Emergency Medicine

## 2021-09-27 ENCOUNTER — Telehealth: Payer: Self-pay

## 2021-09-27 DIAGNOSIS — I471 Supraventricular tachycardia, unspecified: Secondary | ICD-10-CM | POA: Insufficient documentation

## 2021-09-27 DIAGNOSIS — Z3A24 24 weeks gestation of pregnancy: Secondary | ICD-10-CM | POA: Insufficient documentation

## 2021-09-27 DIAGNOSIS — O26892 Other specified pregnancy related conditions, second trimester: Secondary | ICD-10-CM | POA: Diagnosis present

## 2021-09-27 DIAGNOSIS — N3090 Cystitis, unspecified without hematuria: Secondary | ICD-10-CM

## 2021-09-27 DIAGNOSIS — Z20822 Contact with and (suspected) exposure to covid-19: Secondary | ICD-10-CM | POA: Insufficient documentation

## 2021-09-27 DIAGNOSIS — R824 Acetonuria: Secondary | ICD-10-CM | POA: Diagnosis not present

## 2021-09-27 DIAGNOSIS — O2312 Infections of bladder in pregnancy, second trimester: Secondary | ICD-10-CM | POA: Diagnosis not present

## 2021-09-27 DIAGNOSIS — R0602 Shortness of breath: Secondary | ICD-10-CM | POA: Diagnosis not present

## 2021-09-27 DIAGNOSIS — D72829 Elevated white blood cell count, unspecified: Secondary | ICD-10-CM | POA: Diagnosis not present

## 2021-09-27 DIAGNOSIS — O99412 Diseases of the circulatory system complicating pregnancy, second trimester: Secondary | ICD-10-CM | POA: Diagnosis not present

## 2021-09-27 DIAGNOSIS — I493 Ventricular premature depolarization: Secondary | ICD-10-CM

## 2021-09-27 LAB — URINALYSIS, ROUTINE W REFLEX MICROSCOPIC
Bilirubin Urine: NEGATIVE
Glucose, UA: NEGATIVE mg/dL
Hgb urine dipstick: NEGATIVE
Ketones, ur: 5 mg/dL — AB
Nitrite: NEGATIVE
Protein, ur: NEGATIVE mg/dL
Specific Gravity, Urine: 1.026 (ref 1.005–1.030)
pH: 6 (ref 5.0–8.0)

## 2021-09-27 LAB — COMPREHENSIVE METABOLIC PANEL
ALT: 17 U/L (ref 0–44)
AST: 19 U/L (ref 15–41)
Albumin: 3 g/dL — ABNORMAL LOW (ref 3.5–5.0)
Alkaline Phosphatase: 57 U/L (ref 38–126)
Anion gap: 6 (ref 5–15)
BUN: 8 mg/dL (ref 6–20)
CO2: 20 mmol/L — ABNORMAL LOW (ref 22–32)
Calcium: 8.8 mg/dL — ABNORMAL LOW (ref 8.9–10.3)
Chloride: 111 mmol/L (ref 98–111)
Creatinine, Ser: 0.62 mg/dL (ref 0.44–1.00)
GFR, Estimated: >60 mL/min (ref >60)
Glucose, Bld: 107 mg/dL — ABNORMAL HIGH (ref 70–99)
Potassium: 4 mmol/L (ref 3.5–5.1)
Sodium: 137 mmol/L (ref 135–145)
Total Bilirubin: 0.3 mg/dL (ref 0.3–1.2)
Total Protein: 6.4 g/dL — ABNORMAL LOW (ref 6.5–8.1)

## 2021-09-27 LAB — CBC WITH DIFFERENTIAL/PLATELET
Abs Immature Granulocytes: 0.09 10*3/uL — ABNORMAL HIGH (ref 0.00–0.07)
Basophils Absolute: 0.1 10*3/uL (ref 0.0–0.1)
Basophils Relative: 0 %
Eosinophils Absolute: 0.3 10*3/uL (ref 0.0–0.5)
Eosinophils Relative: 2 %
HCT: 36.8 % (ref 36.0–46.0)
Hemoglobin: 12.2 g/dL (ref 12.0–15.0)
Immature Granulocytes: 1 %
Lymphocytes Relative: 22 %
Lymphs Abs: 3.2 10*3/uL (ref 0.7–4.0)
MCH: 28.5 pg (ref 26.0–34.0)
MCHC: 33.2 g/dL (ref 30.0–36.0)
MCV: 86 fL (ref 80.0–100.0)
Monocytes Absolute: 0.7 10*3/uL (ref 0.1–1.0)
Monocytes Relative: 5 %
Neutro Abs: 10.4 10*3/uL — ABNORMAL HIGH (ref 1.7–7.7)
Neutrophils Relative %: 70 %
Platelets: 370 10*3/uL (ref 150–400)
RBC: 4.28 MIL/uL (ref 3.87–5.11)
RDW: 13.9 % (ref 11.5–15.5)
WBC: 14.7 10*3/uL — ABNORMAL HIGH (ref 4.0–10.5)
nRBC: 0 % (ref 0.0–0.2)

## 2021-09-27 LAB — TSH: TSH: 4.312 u[IU]/mL (ref 0.350–4.500)

## 2021-09-27 LAB — RESP PANEL BY RT-PCR (FLU A&B, COVID) ARPGX2
Influenza A by PCR: NEGATIVE
Influenza B by PCR: NEGATIVE
SARS Coronavirus 2 by RT PCR: NEGATIVE

## 2021-09-27 LAB — BRAIN NATRIURETIC PEPTIDE: B Natriuretic Peptide: 14.5 pg/mL (ref 0.0–100.0)

## 2021-09-27 LAB — TROPONIN I (HIGH SENSITIVITY)
Troponin I (High Sensitivity): 4 ng/L (ref <18)
Troponin I (High Sensitivity): 4 ng/L (ref <18)

## 2021-09-27 LAB — PROCALCITONIN: Procalcitonin: <0.1 ng/mL

## 2021-09-27 MED ORDER — SODIUM CHLORIDE 0.9 % IV BOLUS
500.0000 mL | Freq: Once | INTRAVENOUS | Status: AC
  Administered 2021-09-27: 500 mL via INTRAVENOUS

## 2021-09-27 MED ORDER — CEPHALEXIN 500 MG PO CAPS: 500.0000 mg | ORAL_CAPSULE | Freq: Four times a day (QID) | ORAL | 0 refills | Status: AC

## 2021-09-27 MED ORDER — IOHEXOL 350 MG/ML SOLN
75.0000 mL | Freq: Once | INTRAVENOUS | Status: AC | PRN
  Administered 2021-09-27: 75 mL via INTRAVENOUS

## 2021-09-27 NOTE — Telephone Encounter (Signed)
Call to [facility] at [facility] to schedule consult (ordered 09/26/2021). Placed on hold and then Elnita Maxwell stated Dr. Grace Bushy wanted to speak with consult ordering provider. Phone taken to Ms. Sciora.   Following phone call, following verbal orders provided to RN by Ms. Sciora CNM: 1) call client and inform needs L & D evaluation today to r/o pulmonary embolism (okay to go to L & D when work ends today at 1400) and 2) notify  L & D that Dr. Grace Bushy wants her to have an EKG today while at L & D. Per Ms. Sciora, she will call [facility] L & D regarding above.  Call to client with above information. Understanding verbalized. Aware, per Ms. Sciora, that can go to L & D when work ends at 1400. Also counseled that work note could be provided if left work early to go to hospital. Questions answered. Jossie Ng, RN

## 2021-09-27 NOTE — ED Notes (Signed)
Patient transported to CT 

## 2021-09-27 NOTE — Telephone Encounter (Signed)
Per Hazle Coca CNM, following phone call to [facility] L & D, they do not rule out for pulmonary embolism in L & D => done in ED. Call to client to update her with above information. Jossie Ng, RN

## 2021-09-27 NOTE — ED Provider Notes (Signed)
[facility] Provider Note    Event Date/Time   First MD Initiated Contact with Patient 09/27/21 1628     (approximate)   History   Shortness of Breath   HPI  Kimberly Horton is a 26 y.o. female who is [redacted] weeks pregnant, no prior complications during pregnancy, who comes in with chest pain, sob.  She reports before pregnancy having some mild issues with chest pain, sob.  She was post to see a cardiologist but never did.  She denies any history of elevated heart rates previously but since being pregnant she has noted that her heart rate is higher than normal.  No history of pre eclampsia. She reports however the sob and chest pain is getting worse with pregnancy. No Leg swelling or pain in lower extremity.  Used to smoke 2.5 years ago. Sating normal on room air.  Patient reports that she was sent here for rule out of PE due to having elevated heart rate.  Patient does report that she has had normal good fetal movements and denies any gush of fluids or issues with the pregnancy.     Physical Exam   Triage Vital Signs: ED Triage Vitals  Enc Vitals Group     BP 09/27/21 1333 138/83     Pulse Rate 09/27/21 1333 (!) 118     Resp 09/27/21 1333 (!) 24     Temp 09/27/21 1333 98.2 F (36.8 C)     Temp Source 09/27/21 1333 Oral     SpO2 09/27/21 1333 98 %     Weight 09/27/21 1333 287 lb 0.6 oz (130.2 kg)     Height 09/27/21 1333 5\' 11"  (1.803 m)     Head Circumference --      Peak Flow --      Pain Score 09/27/21 1332 0     Pain Loc --      Pain Edu? --      Excl. in GC? --     Most recent vital signs: Vitals:   09/27/21 1333 09/27/21 1618  BP: 138/83 130/81  Pulse: (!) 118 (!) 101  Resp: (!) 24 17  Temp: 98.2 F (36.8 C)   SpO2: 98% 98%     General: Awake, no distress.  CV:  Good peripheral perfusion. Tachy  Resp:  Normal effort.  Abd:  No distention.  Other:  No swelling in leg, no calf tenderness.    ED Results / Procedures /  Treatments   Labs (all labs ordered are listed, but only abnormal results are displayed) Labs Reviewed  COMPREHENSIVE METABOLIC PANEL - Abnormal; Notable for the following components:      Result Value   CO2 20 (*)    Glucose, Bld 107 (*)    Calcium 8.8 (*)    Total Protein 6.4 (*)    Albumin 3.0 (*)    All other components within normal limits  CBC WITH DIFFERENTIAL/PLATELET - Abnormal; Notable for the following components:   WBC 14.7 (*)    Neutro Abs 10.4 (*)    Abs Immature Granulocytes 0.09 (*)    All other components within normal limits  RESP PANEL BY RT-PCR (FLU A&B, COVID) ARPGX2  TSH  PROCALCITONIN  TROPONIN I (HIGH SENSITIVITY)  TROPONIN I (HIGH SENSITIVITY)     EKG  My interpretation of EKG:  Sinus tachy rate of 113, no st elevation, t wave in lead 3, normal intervals.   RADIOLOGY I have reviewed the xray personally  and interpreted  no PNA    PROCEDURES:  Critical Care performed: No  .1-3 Lead EKG Interpretation  Performed by: Concha Se, MD Authorized by: Concha Se, MD     Interpretation: abnormal     ECG rate:  110   ECG rate assessment: tachycardic     Rhythm: sinus tachycardia     Ectopy: PVCs     Conduction: normal      MEDICATIONS ORDERED IN ED: Medications  sodium chloride 0.9 % bolus 500 mL (has no administration in time range)     IMPRESSION / MDM / ASSESSMENT AND PLAN / ED COURSE  I reviewed the triage vital signs and the nursing notes.   Patient's presentation is most consistent with acute presentation with potential threat to life or bodily function.   Differential: Anemia, infectious, cardiac, PE  CBC showed elevated white count hemoglobin stable CMP normal  TSH normal  Had extensive conversation with patient about the benefits and risk of CT PE and limited use of D-dimer, ultrasounds.  Patient understands the risk of radiation to the baby and understands the risk of radiation to her breast would like to to  proceed with CT PE  IMPRESSION: Negative. No evidence of pulmonary embolism or other active disease.  Urine with 11-20 WBCs and rare bacteria therefore will be over cautious and start on some Keflex.  Patient positive for mild ketones and given some fluids.  Heart rates have improved.  We will get her cardiology referral due to elevated heart rate and occasional PVCs which she expressed understanding felt comfortable with discharge.  She understands to return if she develops worsening shortness of breath leg swelling or any other concern  Considered admission but given reassuring work-up and resolving tachycardia patient is comfortable with discharge home      The patient is on the cardiac monitor to evaluate for evidence of arrhythmia and/or significant heart rate changes.      FINAL CLINICAL IMPRESSION(S) / ED DIAGNOSES   Final diagnoses:  [redacted] weeks gestation of pregnancy  Shortness of breath  Cystitis  PVC (premature ventricular contraction)     Rx / DC Orders   ED Discharge Orders          Ordered    Ambulatory referral to Cardiology        09/27/21 2024    cephALEXin (KEFLEX) 500 MG capsule  4 times daily        09/27/21 2027             Note:  This document was prepared using Dragon voice recognition software and may include unintentional dictation errors.   Concha Se, MD 09/27/21 2029

## 2021-09-27 NOTE — ED Provider Triage Note (Signed)
Emergency Medicine Provider Triage Evaluation Note  [facility] Kimberly Horton , a 26 y.o. female  was evaluated in triage.  Pt complains of tachycardia and some shortness of breath.  Patient is [redacted] weeks pregnant.  Has had elevated heart rate since the beginning of pregnancy.  States that she is having shortness of breath her doctor wanted to be evaluated in the ED.  Review of Systems  Positive: Tachycardia, shortness of breath Negative: Fever chills  Physical Exam  BP 138/83 (BP Location: Left Arm)   Pulse (!) 118   Temp 98.2 F (36.8 C) (Oral)   Resp (!) 24   Ht 5\' 11"  (1.803 m)   Wt 130.2 kg   LMP 02/16/2021 (Exact Date)   SpO2 98%   BMI 40.03 kg/m  Gen:   Awake, no distress   Resp:  Normal effort  MSK:   Moves extremities without difficulty  Other:    Medical Decision Making  Medically screening exam initiated at 1:44 PM.  Appropriate orders placed.  Kimberly Horton was informed that the remainder of the evaluation will be completed by another provider, this initial triage assessment does not replace that evaluation, and the importance of remaining in the ED until their evaluation is complete.  Patient's heart rate is elevated, fetal heart heart tones were assessed and are normal   Anice Paganini, PA-C 09/27/21 1345

## 2021-09-27 NOTE — ED Triage Notes (Signed)
Pt is [redacted] weeks pregnant, sent from [facility] health department to r/o PE. Pt c/o SOB with elevated HR , states she has been having issues throughout her pregnancy

## 2021-09-27 NOTE — Discharge Instructions (Signed)
We are starting you on some antibiotics for possible UTI but we will know for sure until your urine culture comes back and you can discuss the results with your OB/GYN.  Return to the ER if you develop worsening symptoms otherwise I have put a cardiology referral in for you for follow-up for your tachycardia and your occasional PVC

## 2021-09-29 LAB — URINE CULTURE: Culture: 10000 — AB

## 2021-10-18 ENCOUNTER — Ambulatory Visit: Payer: Medicaid Other | Admitting: *Deleted

## 2021-10-18 ENCOUNTER — Ambulatory Visit: Payer: Medicaid Other

## 2021-10-18 ENCOUNTER — Ambulatory Visit: Payer: Medicaid Other | Attending: Obstetrics

## 2021-10-18 VITALS — BP 130/75 | HR 94

## 2021-10-18 DIAGNOSIS — Z6841 Body Mass Index (BMI) 40.0 and over, adult: Secondary | ICD-10-CM | POA: Insufficient documentation

## 2021-10-18 DIAGNOSIS — Z3A27 27 weeks gestation of pregnancy: Secondary | ICD-10-CM

## 2021-10-18 DIAGNOSIS — O0932 Supervision of pregnancy with insufficient antenatal care, second trimester: Secondary | ICD-10-CM | POA: Insufficient documentation

## 2021-10-18 DIAGNOSIS — O99212 Obesity complicating pregnancy, second trimester: Secondary | ICD-10-CM | POA: Insufficient documentation

## 2021-10-18 DIAGNOSIS — E669 Obesity, unspecified: Secondary | ICD-10-CM

## 2021-10-19 ENCOUNTER — Other Ambulatory Visit: Payer: Self-pay | Admitting: *Deleted

## 2021-10-19 DIAGNOSIS — R638 Other symptoms and signs concerning food and fluid intake: Secondary | ICD-10-CM

## 2021-10-24 ENCOUNTER — Ambulatory Visit: Payer: Medicaid Other | Admitting: Advanced Practice Midwife

## 2021-10-24 VITALS — BP 130/75 | HR 120 | Temp 98.1°F | Wt 293.0 lb

## 2021-10-24 DIAGNOSIS — O0933 Supervision of pregnancy with insufficient antenatal care, third trimester: Secondary | ICD-10-CM

## 2021-10-24 DIAGNOSIS — N390 Urinary tract infection, site not specified: Secondary | ICD-10-CM | POA: Insufficient documentation

## 2021-10-24 DIAGNOSIS — N342 Other urethritis: Secondary | ICD-10-CM

## 2021-10-24 DIAGNOSIS — O99213 Obesity complicating pregnancy, third trimester: Secondary | ICD-10-CM

## 2021-10-24 DIAGNOSIS — B009 Herpesviral infection, unspecified: Secondary | ICD-10-CM

## 2021-10-24 DIAGNOSIS — Z3403 Encounter for supervision of normal first pregnancy, third trimester: Secondary | ICD-10-CM

## 2021-10-24 DIAGNOSIS — Z3402 Encounter for supervision of normal first pregnancy, second trimester: Secondary | ICD-10-CM

## 2021-10-24 DIAGNOSIS — O0930 Supervision of pregnancy with insufficient antenatal care, unspecified trimester: Secondary | ICD-10-CM

## 2021-10-24 DIAGNOSIS — O99212 Obesity complicating pregnancy, second trimester: Secondary | ICD-10-CM

## 2021-10-24 LAB — URINALYSIS
Bilirubin, UA: NEGATIVE
Glucose, UA: NEGATIVE
Ketones, UA: NEGATIVE
Leukocytes,UA: NEGATIVE
Nitrite, UA: NEGATIVE
Protein,UA: NEGATIVE
RBC, UA: NEGATIVE
Specific Gravity, UA: 1.025 (ref 1.005–1.030)
Urobilinogen, Ur: 0.2 mg/dL (ref 0.2–1.0)
pH, UA: 7 (ref 5.0–7.5)

## 2021-10-24 LAB — WET PREP FOR TRICH, YEAST, CLUE
Trichomonas Exam: NEGATIVE
Yeast Exam: NEGATIVE

## 2021-10-24 LAB — HEMOGLOBIN, FINGERSTICK: Hemoglobin: 12.4 g/dL (ref 11.1–15.9)

## 2021-10-24 NOTE — Progress Notes (Signed)
Hgb, wet mount, and urinalysis reviewed during clinic visit - no treatment indicated.   Patient declined Tdap vaccine during visit stating that she has elevated HR and does not want to take anything that could make it worse. Counseled on importance of vaccine however she decided it would be best for her to have vaccine after she delivered her baby at the hospital during post-partum.   Earlyne Iba, RN

## 2021-10-24 NOTE — Progress Notes (Signed)
[facility]  PRENATAL VISIT NOTE  Subjective:  Kimberly Horton is a 26 y.o. G1P0000 at [redacted]w[redacted]d being seen today for ongoing prenatal care.  She is currently monitored for the following issues for this high-risk pregnancy and has Obesity affecting pregnancy BMI=39.7; Late prenatal care @ 16 1/7; Encounter for supervision of normal first pregnancy in second trimester; Vitiligo at age 66; History of panic attacks age 18; tachycardia, P=149, 135, 121; Hx herpes; and UTI (urinary tract infection) dx'd 09/27/21 [facility] on their problem list.  Patient reports vaginal irritation.  Contractions: Not present. Vag. Bleeding: None.  Movement: Present. Denies leaking of fluid/ROM.   The following portions of the patient's history were reviewed and updated as appropriate: allergies, current medications, past family history, past medical history, past social history, past surgical history and problem list. Problem list updated.  Objective:   Vitals:   10/24/21 0917  BP: 130/75  Pulse: (!) 120  Temp: 98.1 F (36.7 C)  Weight: 293 lb (132.9 kg)    Fetal Status: Fetal Heart Rate (bpm): 155 Fundal Height: 28 cm Movement: Present     General:  Alert, oriented and cooperative. Patient is in no acute distress.  Skin: Skin is warm and dry. No rash noted.   Cardiovascular: Normal heart rate noted  Respiratory: Normal respiratory effort, no problems with respiration noted  Abdomen: Soft, gravid, appropriate for gestational age.  Pain/Pressure: Absent     Pelvic: Cervical exam deferred        Extremities: Normal range of motion.  Edema: None  Mental Status: Normal mood and affect. Normal behavior. Normal judgment and thought content.   Assessment and Plan:  Pregnancy: G1P0000 at [redacted]w[redacted]d  1. Encounter for supervision of normal first pregnancy in second trimester Working 40 hrs/wk Went to ER 09/27/21 for SOB and tachy and chest pain: EKG with PVC's and tachy,  -PE, on cardiac monitor Has cardiology apt 11/23/21 BP 130/75 with P=120; u/a today C/o green d/c with external itching x couple days--wet mount done with white creamy leukorrhea, ph<4.5 Reviewed 10/18/21 u/s at 27 5/7 with posterior placenta, AFI wnl, EFW=36%, EDC=01/16/22 NIPS 08/01/21=neg AFP only 08/01/21=neg  - Hemoglobin, fingerstick - Glucose, 1 hour gestational - HIV-1/HIV-2 Qualitative RNA - RPR - Urinalysis (Urine Dip) - Urine Culture - WET PREP FOR TRICH, YEAST, CLUE  - Hemoglobin, fingerstick - Glucose, 1 hour gestational - HIV-1/HIV-2 Qualitative RNA - RPR - Urinalysis (Urine Dip) - Urine Culture - WET PREP FOR TRICH, YEAST, CLUE  2. Hx herpes Thinks might be having HSV outbreak--none visible; to monitor  3. Hx UTI 09/27/21 dx'd ER C&S TOC today   4. Obesity affecting pregnancy in second trimester 8 lb (3.629 kg) Walking 3x/wk x 20 min but gets tachy Initiated care too late to begin ASA  5. Late prenatal care @ 16 1/7     Preterm labor symptoms and general obstetric precautions including but not limited to vaginal bleeding, contractions, leaking of fluid and fetal movement were reviewed in detail with the patient. Please refer to After Visit Summary for other counseling recommendations.  Return in about 2 weeks (around 11/07/2021) for routine PNC.  Future Appointments  Date Time Provider Department Center  11/23/2021  8:20 AM Debbe Odea, MD [facility]  12/05/2021  7:45 AM [facility] NURSE [facility]  12/05/2021  8:00 AM [facility] US1 [facility]  12/12/2021  7:45 AM [facility] NURSE [facility]  12/12/2021  8:00 AM [facility] US1 [facility]  Alberteen Spindle, CNM

## 2021-10-26 LAB — GLUCOSE, 1 HOUR GESTATIONAL: Gestational Diabetes Screen: 129 mg/dL (ref 70–139)

## 2021-10-26 LAB — URINE CULTURE

## 2021-10-26 LAB — HIV-1/HIV-2 QUALITATIVE RNA
HIV-1 RNA, Qualitative: NONREACTIVE
HIV-2 RNA, Qualitative: NONREACTIVE

## 2021-10-26 LAB — RPR: RPR Ser Ql: NONREACTIVE

## 2021-11-01 ENCOUNTER — Telehealth: Payer: Self-pay | Admitting: Family Medicine

## 2021-11-01 NOTE — Telephone Encounter (Signed)
Patient currently working full time and wants to start cutting down on work due to difficulty breathing and to her being [redacted] weeks pregnant. She spoke to someone at her job and said they'll fax some forms over to get a provider to complete and sign.

## 2021-11-03 NOTE — Telephone Encounter (Signed)
Return call to patient to follow up to see if the [employer] [facility] Coordinator had called her from Thursday and she said nobody had called her.  She reports her employer has already faxed the paperwork needed to [employer] at [phone].  I told her I would re-route the message to Florentina Addison, the [facility] Coordinator to assist with this information and help with the paperwork as needed. Hart Carwin, RN

## 2021-11-06 ENCOUNTER — Telehealth: Payer: Self-pay

## 2021-11-06 NOTE — Telephone Encounter (Signed)
Front desk called [facility]. Stated patient called [employer] asking for completed FMLA paperwork. I called patient to recommend that she bring in blank forms tomorrow at her scheduled visit. Shiela Mayer RN

## 2021-11-07 ENCOUNTER — Ambulatory Visit: Payer: Medicaid Other | Admitting: Advanced Practice Midwife

## 2021-11-07 VITALS — BP 106/73 | HR 124 | Temp 97.7°F | Wt 295.5 lb

## 2021-11-07 DIAGNOSIS — Z3402 Encounter for supervision of normal first pregnancy, second trimester: Secondary | ICD-10-CM

## 2021-11-07 DIAGNOSIS — O0930 Supervision of pregnancy with insufficient antenatal care, unspecified trimester: Secondary | ICD-10-CM

## 2021-11-07 DIAGNOSIS — Z3403 Encounter for supervision of normal first pregnancy, third trimester: Secondary | ICD-10-CM

## 2021-11-07 DIAGNOSIS — O99213 Obesity complicating pregnancy, third trimester: Secondary | ICD-10-CM

## 2021-11-07 DIAGNOSIS — N342 Other urethritis: Secondary | ICD-10-CM

## 2021-11-07 DIAGNOSIS — I471 Supraventricular tachycardia: Secondary | ICD-10-CM

## 2021-11-07 DIAGNOSIS — O0933 Supervision of pregnancy with insufficient antenatal care, third trimester: Secondary | ICD-10-CM

## 2021-11-07 DIAGNOSIS — O99212 Obesity complicating pregnancy, second trimester: Secondary | ICD-10-CM

## 2021-11-07 LAB — HEMOGLOBIN, FINGERSTICK: Hemoglobin: 12.4 g/dL (ref 11.1–15.9)

## 2021-11-07 NOTE — Progress Notes (Signed)
[facility]  PRENATAL VISIT NOTE  Subjective:  Kimberly Horton is a 26 y.o. G1P0000 at [redacted]w[redacted]d being seen today for ongoing prenatal care.  She is currently monitored for the following issues for this high-risk pregnancy and has Obesity affecting pregnancy BMI=39.7; Late prenatal care @ 16 1/7; Encounter for supervision of normal first pregnancy in second trimester; Vitiligo at age 45; History of panic attacks age 39; tachycardia, P=149, 135, 121; Hx herpes; and UTI (urinary tract infection) dx'd 09/27/21 [facility] on their problem list.  Patient reports  continued tachycardia, SOB, fatigue 1x/day at work .  Contractions: Not present. Vag. Bleeding: None.  Movement: Present. Denies leaking of fluid/ROM.   The following portions of the patient's history were reviewed and updated as appropriate: allergies, current medications, past family history, past medical history, past social history, past surgical history and problem list. Problem list updated.  Objective:   Vitals:   11/07/21 0932  Weight: 295 lb 8 oz (134 kg)    Fetal Status: Fetal Heart Rate (bpm): 155 Fundal Height: 31 cm Movement: Present     General:  Alert, oriented and cooperative. Patient is in no acute distress.  Skin: Skin is warm and dry. No rash noted.   Cardiovascular: Normal heart rate noted  Respiratory: Normal respiratory effort, no problems with respiration noted  Abdomen: Soft, gravid, appropriate for gestational age.  Pain/Pressure: Absent     Pelvic: Cervical exam deferred        Extremities: Normal range of motion.  Edema: None  Mental Status: Normal mood and affect. Normal behavior. Normal judgment and thought content.   Assessment and Plan:  Pregnancy: G1P0000 at [redacted]w[redacted]d  1. tachycardia, P=149, 135, 121 Has cardiology apt 11/23/21 Check Hgb today - Hemoglobin, venipuncture  2. Late prenatal care @ 16 1/7   3. Obesity affecting pregnancy in second  trimester 10 lb 8 oz (4.763 kg) Initiated care too late for ASA daily Not exercising due to tachycardia  4. Infective urethritis C&S TOC  10/24/21=neg  5. Encounter for supervision of normal first pregnancy in second trimester 10 lb 8 oz (4.763 kg) Working 40 hrs/wk at [employer] and brought in accomodations form to change to more sedentary job due to tachycardia and SOB--need job description Completed form MFM f/u [facility] 12/05/21 and 12/12/21 due to obesity   Preterm labor symptoms and general obstetric precautions including but not limited to vaginal bleeding, contractions, leaking of fluid and fetal movement were reviewed in detail with the patient. Please refer to After Visit Summary for other counseling recommendations.  Return in about 2 weeks (around 11/21/2021) for routine PNC.  Future Appointments  Date Time Provider Department Center  11/21/2021  8:40 AM [facility] PROVIDER [facility] None  11/23/2021  8:20 AM Debbe Odea, MD [facility]  12/05/2021  7:45 AM [facility] NURSE [facility]  12/05/2021  8:00 AM [facility] US1 [facility]  12/12/2021  7:45 AM [facility] NURSE [facility]  12/12/2021  8:00 AM [facility] US1 [facility]    Alberteen Spindle, CNM

## 2021-11-07 NOTE — Progress Notes (Signed)
Plans to select pediatrician this week and given another Pediatrician Resource List as unsure if can locate the one at home. Aware of 11/23/21 cardiology appt and aware of [facility] [place] appts 12/05/21 and 12/05/21. ROI for [employer] signed as provider completed Accomodation Request and Document Request for client's employer ([employer]). Client returned to client and picked up completed accomodation request form. Hgb = 12.4 and no interventions needed per standing order. BP taken today with large cuff after client had sat ~ 10 minutes. Jossie Ng, RN

## 2021-11-21 ENCOUNTER — Encounter: Payer: Self-pay | Admitting: Nurse Practitioner

## 2021-11-21 ENCOUNTER — Ambulatory Visit: Payer: Medicaid Other | Admitting: Nurse Practitioner

## 2021-11-21 VITALS — BP 121/84 | HR 90 | Temp 97.3°F | Wt 297.6 lb

## 2021-11-21 DIAGNOSIS — I471 Supraventricular tachycardia, unspecified: Secondary | ICD-10-CM

## 2021-11-21 DIAGNOSIS — B009 Herpesviral infection, unspecified: Secondary | ICD-10-CM

## 2021-11-21 DIAGNOSIS — O99210 Obesity complicating pregnancy, unspecified trimester: Secondary | ICD-10-CM

## 2021-11-21 DIAGNOSIS — Z3403 Encounter for supervision of normal first pregnancy, third trimester: Secondary | ICD-10-CM

## 2021-11-21 LAB — URINALYSIS
Bilirubin, UA: NEGATIVE
Glucose, UA: NEGATIVE
Ketones, UA: NEGATIVE
Nitrite, UA: NEGATIVE
Protein,UA: NEGATIVE
RBC, UA: NEGATIVE
Specific Gravity, UA: 1.025 (ref 1.005–1.030)
Urobilinogen, Ur: 0.2 mg/dL (ref 0.2–1.0)
pH, UA: 6 (ref 5.0–7.5)

## 2021-11-21 NOTE — Progress Notes (Signed)
Pt presents today at 32 weeks; kick counts given and pt verb u/o; pt c/o cramping for 2-3 days, irregular and goes away; also states headaches for 2-3 days on and off; denies any blurred vision or epigastric pain; states she has some shortness of breath when she gets up to walk and at random times; c/o dizziness on and off; pt brought accomodation paperwork to be completed; pt states she has had clear to white vaginal discharge for a couple of weeks; states slight swelling in left foot; c/o lower back discomfort when standing at work.Florina Ou, RN   Urine reviewed by Onalee Hua, NP and BP rechecked and was 121/84 and provider notified.Florina Ou, RN

## 2021-11-21 NOTE — Progress Notes (Signed)
[facility]  PRENATAL VISIT NOTE  Subjective:  Kimberly Horton is a 26 y.o. G1P0000 at [redacted]w[redacted]d being seen today for ongoing prenatal care.  She is currently monitored for the following issues for this high-risk pregnancy and has Obesity affecting pregnancy BMI=39.7; Late prenatal care @ 16 1/7; Encounter for supervision of normal first pregnancy in second trimester; Vitiligo at age 72; History of panic attacks age 36; tachycardia, P=149, 135, 121; Hx herpes; and UTI (urinary tract infection) dx'd 09/27/21 [facility] on their problem list.  Patient reports  some cramping on and off with exertion or being on her feet for extended periods of time, continous shortness of breath, and headache on and off since last week  .  Contractions: Not present. Vag. Bleeding: None.  Movement: Present. Denies leaking of fluid/ROM.   The following portions of the patient's history were reviewed and updated as appropriate: allergies, current medications, past family history, past medical history, past social history, past surgical history and problem list. Problem list updated.  Objective:   Vitals:   11/21/21 0834 11/21/21 0923  BP: (!) 145/80 121/84  Pulse: 90   Temp: (!) 97.3 F (36.3 C)   Weight: 297 lb 9.6 oz (135 kg)     Fetal Status: Fetal Heart Rate (bpm): 145 Fundal Height: 33 cm Movement: Present     General:  Alert, oriented and cooperative. Patient is in no acute distress.  Skin: Skin is warm and dry. No rash noted.   Cardiovascular: Normal heart rate noted  Respiratory: Normal respiratory effort, no problems with respiration noted  Abdomen: Soft, gravid, appropriate for gestational age.  Pain/Pressure: Present     Pelvic: Cervical exam deferred        Extremities: Normal range of motion.  Edema: Trace  Mental Status: Normal mood and affect. Normal behavior. Normal judgment and thought content.   Assessment and Plan:  Pregnancy: G1P0000 at  [redacted]w[redacted]d  1. Encounter for supervision of normal first pregnancy in third trimester -26 year old female in clinic today for prenatal care.  -Patient reports taking PNV daily.  -ROS reviewed today.  Patient reports some cramping on and off with exertion or from being on her feet for extended periods of time, continous shortness of breath, and headaches on and off since last week.  Patient states that since last week she noticed that she has had some cramping on and off especially if she has been on her feet all day along with some lengthy walking and lifting.   Advised to take frequent breaks and lay on left side if she able to.  Patient also presented work limitation paperwork due to cramping and shortness of breath.  Patient reports shortness of breath has been present since before pregnancy but has increased with pregnancy. Shortness of breath also noted at rest and with exertion.  Patient has a follow up with Cardiology on 11/23/21.  Patient also reports headaches on and off for one week.  Patient encouraged to increase hydration and sent to the lab for a urine dip.  Denies current headache, mid epigastric pain, and floaters.   -Urine dip negative today.     - Urinalysis (Urine Dip)  2. Hx herpes -HSV suppressive therapy starting at 36 weeks.    3. Obesity affecting pregnancy, antepartum -Encouraged to limit carbs, fat, and sugars. Not currently exercising due to SOB and tachycardia. -12 lb 9.6 oz (5.715 kg)    4. tachycardia, P=149, 135, 121 -Has  cardiology follow up appointment 11/23/21.    Term labor symptoms and general obstetric precautions including but not limited to vaginal bleeding, contractions, leaking of fluid and fetal movement were reviewed in detail with the patient. Please refer to After Visit Summary for other counseling recommendations.   Return in about 2 weeks (around 12/05/2021) for Routine prenatal care visit.  Future Appointments  Date Time Provider Department Center   11/23/2021  8:20 AM Debbe Odea, MD [facility] None  12/05/2021  7:45 AM [facility] NURSE [facility]  12/05/2021  8:00 AM [facility] US1 [facility]  12/05/2021  1:40 PM [facility] PROVIDER [facility] None  12/12/2021  7:45 AM [facility] NURSE [facility]  12/12/2021  8:00 AM [facility] US1 [facility]    Glenna Fellows, FNP

## 2021-11-23 ENCOUNTER — Encounter: Payer: Self-pay | Admitting: Cardiology

## 2021-11-23 ENCOUNTER — Ambulatory Visit (INDEPENDENT_AMBULATORY_CARE_PROVIDER_SITE_OTHER): Payer: Medicaid Other

## 2021-11-23 ENCOUNTER — Ambulatory Visit: Payer: Medicaid Other | Attending: Cardiology | Admitting: Cardiology

## 2021-11-23 VITALS — BP 118/74 | HR 101 | Ht 71.0 in | Wt 297.4 lb

## 2021-11-23 DIAGNOSIS — I493 Ventricular premature depolarization: Secondary | ICD-10-CM

## 2021-11-23 DIAGNOSIS — R002 Palpitations: Secondary | ICD-10-CM

## 2021-11-23 DIAGNOSIS — R0609 Other forms of dyspnea: Secondary | ICD-10-CM | POA: Diagnosis not present

## 2021-11-23 NOTE — Patient Instructions (Signed)
Medication Instructions:   Your physician recommends that you continue on your current medications as directed. Please refer to the Current Medication list given to you today.   *If you need a refill on your cardiac medications before your next appointment, please call your pharmacy*    Testing/Procedures:   Your physician has requested that you have an echocardiogram in 3 weeks. Echocardiography is a painless test that uses sound waves to create images of your heart. It provides your doctor with information about the size and shape of your heart and how well your heart's chambers and valves are working. This procedure takes approximately one hour. There are no restrictions for this procedure.  2.    Your physician has recommended that you wear a Zio XT monitor for 2 weeks. This will be mailed to your home address in 4-5 business days.   Your clinician has requested a Zio heart rhythm monitor by i[employer] to be mailed to your home for you to wear for 14 days. You should expect a small box to arrive via USPS (or FedEx in some cases) within this next week. If you do not receive it please call i[employer] at [phone].  Closely watching your heart at this time will help your care team understand more and provide information needed to develop your plan of care.  Please apply your Zio patch monitor the day you receive it. Keep this packaging, you will use this to return your Zio monitor.  You will easily be able to apply the monitor with the instructions provided in the Patient Guide.  If you need assistance, [employer] representatives are available 24/7 at [phone].  You can also download the [facility] app on your phone to view detailed application instructions and log symptoms.  After you wear your monitor for 14 days, place it back in the blue box or envelope, along with your Symptom Log.  To send your monitor back: Simply use the pre-addressed and pre-paid box/envelope.  Send it back  through [employer] the same day you remove it via your local post office or by placing it in your mailbox.  As soon as we receive the results, they will be reviewed and your clinician will contact you.  For the first 24 hours- it is essential to not shower or exercise, to allow the patch to adhere to your skin. Avoid excessive sweating to help maximize wear time. Do not submerge the device, no hot tubs, and no swimming pools. Keep any lotions or oils away from the patch. After 24 hours you may shower with the patch on. Take brief showers with your back facing the shower head.  Do not remove patch once it has been placed because that will interrupt data and decrease adhesive wear time. Push the button when you have any symptoms and write down what you were feeling. Once you have completed wearing your monitor, remove and place into box which has postage paid and place in your outgoing mailbox.  If for some reason you have misplaced your box then call our office and we can provide another box and/or mail it off for you.    Follow-Up: At [facility], you and your health needs are our priority.  As part of our continuing mission to provide you with exceptional heart care, we have created designated Provider Care Teams.  These Care Teams include your primary Cardiologist (physician) and Advanced Practice Providers (APPs -  Physician Assistants and Nurse Practitioners) who all work together to provide  you with the care you need, when you need it.  We recommend signing up for the patient portal called "MyChart".  Sign up information is provided on this After Visit Summary.  MyChart is used to connect with patients for Virtual Visits (Telemedicine).  Patients are able to view lab/test results, encounter notes, upcoming appointments, etc.  Non-urgent messages can be sent to your provider as well.   To learn more about what you can do with MyChart, go to [email or web address].    Your next  appointment:   6 week(s)  The format for your next appointment:   In Person  Provider:   You may see Debbe Odea, MD or one of the following Advanced Practice Providers on your designated Care Team:   Nicolasa Ducking, NP Eula Listen, PA-C Cadence Fransico Michael, PA-C Charlsie Quest, NP     Important Information About Sugar
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
## 2021-11-24 ENCOUNTER — Telehealth: Payer: Self-pay

## 2021-11-24 NOTE — Telephone Encounter (Addendum)
TC to client to let her know that her paperwork for work accomodations is ready for her to pick up. Patient states she will come this afternoon to collect her paperwork. Copy sent for scanning. Patient came to [facility] and now has her paperwork and states she will turn it in asap. Patient aware of next MH RV on 12/05/21.Marland KitchenBurt Knack, RN

## 2021-11-25 DIAGNOSIS — I493 Ventricular premature depolarization: Secondary | ICD-10-CM | POA: Diagnosis not present

## 2021-11-25 DIAGNOSIS — R002 Palpitations: Secondary | ICD-10-CM | POA: Diagnosis not present

## 2021-11-27 NOTE — Addendum Note (Signed)
Addended by: Heywood Bene on: 11/27/2021 11:17 AM   Modules accepted: Orders

## 2021-12-05 ENCOUNTER — Ambulatory Visit: Payer: Medicaid Other

## 2021-12-05 ENCOUNTER — Encounter: Payer: Self-pay | Admitting: *Deleted

## 2021-12-05 ENCOUNTER — Ambulatory Visit: Payer: Medicaid Other | Admitting: *Deleted

## 2021-12-05 ENCOUNTER — Other Ambulatory Visit: Payer: Self-pay | Admitting: *Deleted

## 2021-12-05 ENCOUNTER — Ambulatory Visit: Payer: Medicaid Other | Admitting: Advanced Practice Midwife

## 2021-12-05 ENCOUNTER — Ambulatory Visit: Payer: Medicaid Other | Attending: Obstetrics and Gynecology

## 2021-12-05 VITALS — BP 109/73 | HR 114 | Temp 97.4°F | Wt 301.0 lb

## 2021-12-05 DIAGNOSIS — E669 Obesity, unspecified: Secondary | ICD-10-CM | POA: Diagnosis not present

## 2021-12-05 DIAGNOSIS — Z3402 Encounter for supervision of normal first pregnancy, second trimester: Secondary | ICD-10-CM

## 2021-12-05 DIAGNOSIS — I471 Supraventricular tachycardia: Secondary | ICD-10-CM

## 2021-12-05 DIAGNOSIS — O99210 Obesity complicating pregnancy, unspecified trimester: Secondary | ICD-10-CM

## 2021-12-05 DIAGNOSIS — O0930 Supervision of pregnancy with insufficient antenatal care, unspecified trimester: Secondary | ICD-10-CM

## 2021-12-05 DIAGNOSIS — O99213 Obesity complicating pregnancy, third trimester: Secondary | ICD-10-CM

## 2021-12-05 DIAGNOSIS — O99212 Obesity complicating pregnancy, second trimester: Secondary | ICD-10-CM | POA: Diagnosis not present

## 2021-12-05 DIAGNOSIS — Z3A34 34 weeks gestation of pregnancy: Secondary | ICD-10-CM | POA: Diagnosis not present

## 2021-12-05 DIAGNOSIS — R638 Other symptoms and signs concerning food and fluid intake: Secondary | ICD-10-CM | POA: Diagnosis present

## 2021-12-05 NOTE — Progress Notes (Signed)
Kept 12/05/2021 [facility] [place] appt. Verified with client that she is aware of all scheduled [facility] appts and all cardiology appt [facility]). Kept cardiology appt 11/23/21. Client states keeps up with appts in My Chart. Rich Number, RN

## 2021-12-05 NOTE — Progress Notes (Signed)
[place] [facility]  PRENATAL VISIT NOTE  Subjective:  Kimberly Horton is a 26 y.o. G1P0000 at [redacted]w[redacted]d being seen today for ongoing prenatal care.  She is currently monitored for the following issues for this high-risk pregnancy and has Obesity affecting pregnancy BMI=39.7; Late prenatal care @ 16 1/7; Encounter for supervision of normal first pregnancy in second trimester; Vitiligo at age 55; History of panic attacks age 9; tachycardia, P=149, 135, 121; Hx herpes; and UTI (urinary tract infection) dx'd 09/27/21 [facility] on their problem list.  Patient reports  SOB, DOE continues .  Contractions: Not present. Vag. Bleeding: None.  Movement: Present. Denies leaking of fluid/ROM.   The following portions of the patient's history were reviewed and updated as appropriate: allergies, current medications, past family history, past medical history, past social history, past surgical history and problem list. Problem list updated.  Objective:   Vitals:   12/05/21 1343  BP: 109/73  Pulse: (!) 114  Temp: (!) 97.4 F (36.3 C)  Weight: (!) 301 lb (136.5 kg)    Fetal Status: Fetal Heart Rate (bpm): 150 Fundal Height: 34 cm Movement: Present     General:  Alert, oriented and cooperative. Patient is in no acute distress.  Skin: Skin is warm and dry. No rash noted.   Cardiovascular: Normal heart rate noted  Respiratory: Normal respiratory effort, no problems with respiration noted  Abdomen: Soft, gravid, appropriate for gestational age.  Pain/Pressure: Absent     Pelvic: Cervical exam deferred        Extremities: Normal range of motion.  Edema: None  Mental Status: Normal mood and affect. Normal behavior. Normal judgment and thought content.   Assessment and Plan:  Pregnancy: G1P0000 at [redacted]w[redacted]d  1. Encounter for supervision of normal first pregnancy in second trimester Working at [employer] 40 hrs/wk Reviewed 12/05/21 u/s at 34 1/7 with posterior placenta,  EFW=47%, AFI wnl, growth wnl, BPP 8/8, ordered weekly AP testing due to obesity Has crib Baby shower in 2 wks  2. tachycardia, P=149, 135, 121 Kept cardiology apt 11/23/21 for SOB, DOE, palpitations. EKG with sinus tachycardia, frequent PVC's, HR 101.  Cardiologist suspects symptoms due to pregnancy and obesity Plan cardiac monitor x 2 wks which pt is wearing now Echo 12/29/21 F/u cardiology apt 01/11/22  3. Late prenatal care @ 16 1/7   4. Obesity affecting pregnancy, antepartum 16 lb (7.258 kg) 4 lb wt gain in past 2 wks Walking 3x/wk slowly x 10-15 min Dr. Geroge Baseman ordered weekly BPP's due to obesity; last BPP 12/05/21   Preterm labor symptoms and general obstetric precautions including but not limited to vaginal bleeding, contractions, leaking of fluid and fetal movement were reviewed in detail with the patient. Please refer to After Visit Summary for other counseling recommendations.  Return in about 2 weeks (around 12/19/2021) for routine PNC, 36 wk labs.  Future Appointments  Date Time Provider [place]  12/12/2021  7:45 AM [facility] NURSE [facility]  12/12/2021  8:00 AM [facility] US1 [facility]  12/19/2021  7:15 AM [facility] NURSE [facility]  12/19/2021  7:30 AM [facility] US3 [facility] [place]  12/27/2021  3:15 PM [facility] NURSE [facility] [place]  12/27/2021  3:30 PM [facility] US3 [facility]  12/29/2021  7:30 AM [facility]-CV BURL [place] 1 CV-BURL None  01/02/2022  8:30 AM [facility] NURSE [facility]  01/02/2022  8:45 AM [facility] US4 [facility]-M[facility]US [place]  01/11/2022  8:00 AM Agbor-Etang, Aaron Edelman, MD CVD-BURL None  Herbie Saxon, CNM

## 2021-12-12 ENCOUNTER — Ambulatory Visit: Payer: Medicaid Other | Attending: Obstetrics and Gynecology | Admitting: *Deleted

## 2021-12-12 ENCOUNTER — Ambulatory Visit (HOSPITAL_BASED_OUTPATIENT_CLINIC_OR_DEPARTMENT_OTHER): Payer: Medicaid Other

## 2021-12-12 VITALS — BP 122/81 | HR 138

## 2021-12-12 DIAGNOSIS — Z3A35 35 weeks gestation of pregnancy: Secondary | ICD-10-CM | POA: Insufficient documentation

## 2021-12-12 DIAGNOSIS — R638 Other symptoms and signs concerning food and fluid intake: Secondary | ICD-10-CM | POA: Diagnosis not present

## 2021-12-12 DIAGNOSIS — O26613 Liver and biliary tract disorders in pregnancy, third trimester: Secondary | ICD-10-CM | POA: Diagnosis not present

## 2021-12-12 DIAGNOSIS — O99213 Obesity complicating pregnancy, third trimester: Secondary | ICD-10-CM | POA: Diagnosis not present

## 2021-12-13 ENCOUNTER — Other Ambulatory Visit: Payer: Medicaid Other

## 2021-12-13 ENCOUNTER — Ambulatory Visit: Payer: Medicaid Other

## 2021-12-19 ENCOUNTER — Ambulatory Visit: Payer: Medicaid Other | Attending: Obstetrics

## 2021-12-19 ENCOUNTER — Ambulatory Visit: Payer: Medicaid Other | Admitting: *Deleted

## 2021-12-19 ENCOUNTER — Ambulatory Visit: Payer: Medicaid Other | Admitting: Advanced Practice Midwife

## 2021-12-19 VITALS — BP 131/94 | HR 84

## 2021-12-19 VITALS — BP 133/77 | HR 114 | Temp 97.9°F | Wt 303.0 lb

## 2021-12-19 DIAGNOSIS — E669 Obesity, unspecified: Secondary | ICD-10-CM

## 2021-12-19 DIAGNOSIS — O0930 Supervision of pregnancy with insufficient antenatal care, unspecified trimester: Secondary | ICD-10-CM

## 2021-12-19 DIAGNOSIS — O163 Unspecified maternal hypertension, third trimester: Secondary | ICD-10-CM

## 2021-12-19 DIAGNOSIS — O99210 Obesity complicating pregnancy, unspecified trimester: Secondary | ICD-10-CM

## 2021-12-19 DIAGNOSIS — B009 Herpesviral infection, unspecified: Secondary | ICD-10-CM

## 2021-12-19 DIAGNOSIS — O99213 Obesity complicating pregnancy, third trimester: Secondary | ICD-10-CM | POA: Diagnosis present

## 2021-12-19 DIAGNOSIS — Z3A36 36 weeks gestation of pregnancy: Secondary | ICD-10-CM | POA: Diagnosis not present

## 2021-12-19 DIAGNOSIS — Z3402 Encounter for supervision of normal first pregnancy, second trimester: Secondary | ICD-10-CM

## 2021-12-19 DIAGNOSIS — I471 Supraventricular tachycardia, unspecified: Secondary | ICD-10-CM

## 2021-12-19 DIAGNOSIS — O0933 Supervision of pregnancy with insufficient antenatal care, third trimester: Secondary | ICD-10-CM

## 2021-12-19 DIAGNOSIS — Z3403 Encounter for supervision of normal first pregnancy, third trimester: Secondary | ICD-10-CM

## 2021-12-19 LAB — URINALYSIS
Bilirubin, UA: NEGATIVE
Glucose, UA: NEGATIVE
Ketones, UA: NEGATIVE
Nitrite, UA: NEGATIVE
Protein,UA: NEGATIVE
Specific Gravity, UA: 1.025 (ref 1.005–1.030)
Urobilinogen, Ur: 0.2 mg/dL (ref 0.2–1.0)
pH, UA: 6 (ref 5.0–7.5)

## 2021-12-19 NOTE — Progress Notes (Signed)
C/o pelvic floor pressure but none at present time. Urine Dip reviewed at visit by provider. BThiele RN

## 2021-12-19 NOTE — Progress Notes (Signed)
[place] [facility]  PRENATAL VISIT NOTE  Subjective:  Kimberly Horton Kimberly Horton is a 26 y.o. G1P0000 at [redacted]w[redacted]d being seen today for ongoing prenatal care.  She is currently monitored for the following issues for this high-risk pregnancy and has Obesity affecting pregnancy BMI=39.7; Late prenatal care @ 16 1/7; Encounter for supervision of normal first pregnancy in second trimester; Vitiligo at age 23; History of panic attacks age 86; tachycardia, P=149, 135, 121; Hx herpes; UTI (urinary tract infection) dx'd 09/27/21 [facility]; and Elevated blood pressure affecting pregnancy in third trimester, antepartum 133/96, 131/94 on 12/19/21 on their problem list.  Patient reports headache.  Contractions: Not present. Vag. Bleeding: None.  Movement: Present. Denies leaking of fluid/ROM.   The following portions of the patient's history were reviewed and updated as appropriate: allergies, current medications, past family history, past medical history, past social history, past surgical history and problem list. Problem list updated.  Objective:   Vitals:   12/19/21 1531  BP: 133/77  Pulse: (!) 114  Temp: 97.9 F (36.6 C)  Weight: (!) 303 lb (137.4 kg)    Fetal Status: Fetal Heart Rate (bpm): 135 Fundal Height: 38 cm Movement: Present  Presentation: Vertex  General:  Alert, oriented and cooperative. Patient is in no acute distress.  Skin: Skin is warm and dry. No rash noted.   Cardiovascular: Normal heart rate noted  Respiratory: Normal respiratory effort, no problems with respiration noted  Abdomen: Soft, gravid, appropriate for gestational age.  Pain/Pressure: Absent     Pelvic: Cervical exam deferred        Extremities: Normal range of motion.  Edema: None  Mental Status: Normal mood and affect. Normal behavior. Normal judgment and thought content.   Assessment and Plan:  Pregnancy: G1P0000 at [redacted]w[redacted]d  1. Hx herpes Suppressive therapy Valacyclovir 500 mg BID  to begin today--rx given  2. Encounter for supervision of normal first pregnancy in second trimester GC/Chlamydia/GBS cultures done Knows when to go to L&D Has car seats, crib Baby shower is 12/23/21 C/o h/a 2-3x/wk relieved with a nap and scotoma 3x/wk. Denies epigastric pain Reviewed 12/19/21 u/s at 36.0 with AFI wnl, vtx, BPP 8/8, posterior placenta, 133/96 and 131/94 with recommendation of IOL at 37 wks if BP elevated next week Working 40 hrs/wk at [employer] Has weekly BPP's 12/19/21, 12/27/21, 01/02/22, 01/11/22  - Urinalysis (Urine Dip) - GBS Culture - Chlamydia/GC NAA, Confirmation  3. Late prenatal care @ 16 1/7   4. Obesity affecting pregnancy, antepartum, unspecified obesity type 18 lb (8.165 kg) 2 lb wt gain in past 2 weeks Stopped taking ASA  5. tachycardia, P=149, 135, 121 Referred for MFM consult on 09/26/21 Cardio apt 11/23/21 (EKG=sinus tachy, frequent PVC's, HR 101), Echo ordered=12/29/21 Cardiac monitor= F/u cardio apt 01/11/22  6. Elevated blood pressure affecting pregnancy in third trimester, antepartum 133/96, 133/77 on 12/19/21 At [facility] today BP=133/96 and 131/94 with recommendation IOL at 37 wks if BP remain elevated next week Urine dip today=neg proteinuria C/o h/a 2-3x/wk relieved with naps and scotoma 3x/wk, denies epigastric pain   Preterm labor symptoms and general obstetric precautions including but not limited to vaginal bleeding, contractions, leaking of fluid and fetal movement were reviewed in detail with the patient. Please refer to After Visit Summary for other counseling recommendations.  Return in about 1 week (around 12/26/2021) for routine PNC.  Future Appointments  Date Time Provider [place]  12/27/2021  3:15 PM [facility] NURSE [facility]  12/27/2021  3:30 PM [facility] US3 [facility]  12/29/2021  7:30 AM [facility]-CV BURL [place] 1 CV-BURL None  01/02/2022  8:30 AM [facility] NURSE [facility]  01/02/2022  8:45 AM [facility] US4 [facility]   01/11/2022  8:00 AM Agbor-Etang, Aaron Edelman, MD CVD-BURL None    Herbie Saxon, CNM

## 2021-12-21 LAB — CHLAMYDIA/GC NAA, CONFIRMATION
Chlamydia trachomatis, NAA: NEGATIVE
Neisseria gonorrhoeae, NAA: NEGATIVE

## 2021-12-21 LAB — OB RESULTS CONSOLE GC/CHLAMYDIA
Chlamydia: NEGATIVE
Neisseria Gonorrhea: NEGATIVE

## 2021-12-22 DIAGNOSIS — B951 Streptococcus, group B, as the cause of diseases classified elsewhere: Secondary | ICD-10-CM | POA: Insufficient documentation

## 2021-12-22 LAB — CULTURE, BETA STREP (GROUP B ONLY): Strep Gp B Culture: POSITIVE — AB

## 2021-12-27 ENCOUNTER — Ambulatory Visit (HOSPITAL_BASED_OUTPATIENT_CLINIC_OR_DEPARTMENT_OTHER): Payer: Medicaid Other

## 2021-12-27 ENCOUNTER — Other Ambulatory Visit: Payer: Self-pay

## 2021-12-27 ENCOUNTER — Encounter (HOSPITAL_COMMUNITY): Payer: Self-pay | Admitting: Obstetrics and Gynecology

## 2021-12-27 ENCOUNTER — Inpatient Hospital Stay (HOSPITAL_COMMUNITY)
Admission: AD | Admit: 2021-12-27 | Discharge: 2021-12-31 | DRG: 807 | Disposition: A | Discharge status: Home / Self Care | Payer: Medicaid Other | Attending: Obstetrics & Gynecology | Admitting: Obstetrics & Gynecology

## 2021-12-27 ENCOUNTER — Ambulatory Visit: Payer: Medicaid Other | Admitting: *Deleted

## 2021-12-27 ENCOUNTER — Ambulatory Visit: Payer: Medicaid Other | Attending: Maternal & Fetal Medicine | Admitting: Maternal & Fetal Medicine

## 2021-12-27 VITALS — BP 147/84 | HR 115

## 2021-12-27 DIAGNOSIS — O99213 Obesity complicating pregnancy, third trimester: Secondary | ICD-10-CM | POA: Insufficient documentation

## 2021-12-27 DIAGNOSIS — O133 Gestational [pregnancy-induced] hypertension without significant proteinuria, third trimester: Secondary | ICD-10-CM

## 2021-12-27 DIAGNOSIS — O99892 Other specified diseases and conditions complicating childbirth: Secondary | ICD-10-CM | POA: Diagnosis present

## 2021-12-27 DIAGNOSIS — Z3A37 37 weeks gestation of pregnancy: Secondary | ICD-10-CM | POA: Diagnosis not present

## 2021-12-27 DIAGNOSIS — Z3403 Encounter for supervision of normal first pregnancy, third trimester: Secondary | ICD-10-CM | POA: Diagnosis not present

## 2021-12-27 DIAGNOSIS — B009 Herpesviral infection, unspecified: Secondary | ICD-10-CM | POA: Diagnosis present

## 2021-12-27 DIAGNOSIS — R000 Tachycardia, unspecified: Secondary | ICD-10-CM | POA: Diagnosis present

## 2021-12-27 DIAGNOSIS — B951 Streptococcus, group B, as the cause of diseases classified elsewhere: Secondary | ICD-10-CM | POA: Insufficient documentation

## 2021-12-27 DIAGNOSIS — O99214 Obesity complicating childbirth: Secondary | ICD-10-CM | POA: Diagnosis present

## 2021-12-27 DIAGNOSIS — O99210 Obesity complicating pregnancy, unspecified trimester: Secondary | ICD-10-CM | POA: Diagnosis present

## 2021-12-27 DIAGNOSIS — O134 Gestational [pregnancy-induced] hypertension without significant proteinuria, complicating childbirth: Principal | ICD-10-CM | POA: Diagnosis present

## 2021-12-27 DIAGNOSIS — O99824 Streptococcus B carrier state complicating childbirth: Secondary | ICD-10-CM | POA: Diagnosis present

## 2021-12-27 DIAGNOSIS — E669 Obesity, unspecified: Secondary | ICD-10-CM | POA: Diagnosis not present

## 2021-12-27 DIAGNOSIS — O139 Gestational [pregnancy-induced] hypertension without significant proteinuria, unspecified trimester: Secondary | ICD-10-CM | POA: Diagnosis present

## 2021-12-27 DIAGNOSIS — I471 Supraventricular tachycardia, unspecified: Secondary | ICD-10-CM | POA: Diagnosis present

## 2021-12-27 LAB — COMPREHENSIVE METABOLIC PANEL
ALT: 19 U/L (ref 0–44)
AST: 24 U/L (ref 15–41)
Albumin: 2.6 g/dL — ABNORMAL LOW (ref 3.5–5.0)
Alkaline Phosphatase: 150 U/L — ABNORMAL HIGH (ref 38–126)
Anion gap: 7 (ref 5–15)
BUN: 6 mg/dL (ref 6–20)
CO2: 18 mmol/L — ABNORMAL LOW (ref 22–32)
Calcium: 8.7 mg/dL — ABNORMAL LOW (ref 8.9–10.3)
Chloride: 109 mmol/L (ref 98–111)
Creatinine, Ser: 0.54 mg/dL (ref 0.44–1.00)
GFR, Estimated: >60 mL/min (ref >60)
Glucose, Bld: 136 mg/dL — ABNORMAL HIGH (ref 70–99)
Potassium: 3.7 mmol/L (ref 3.5–5.1)
Sodium: 134 mmol/L — ABNORMAL LOW (ref 135–145)
Total Bilirubin: <0.1 mg/dL — ABNORMAL LOW (ref 0.3–1.2)
Total Protein: 6.2 g/dL — ABNORMAL LOW (ref 6.5–8.1)

## 2021-12-27 LAB — PROTEIN / CREATININE RATIO, URINE
Creatinine, Urine: 85 mg/dL
Protein Creatinine Ratio: 0.13 mg/mg{Cre} (ref 0.00–0.15)
Total Protein, Urine: 11 mg/dL

## 2021-12-27 LAB — CBC
HCT: 36.3 % (ref 36.0–46.0)
Hemoglobin: 11.8 g/dL — ABNORMAL LOW (ref 12.0–15.0)
MCH: 27.3 pg (ref 26.0–34.0)
MCHC: 32.5 g/dL (ref 30.0–36.0)
MCV: 84 fL (ref 80.0–100.0)
Platelets: 292 10*3/uL (ref 150–400)
RBC: 4.32 MIL/uL (ref 3.87–5.11)
RDW: 15.5 % (ref 11.5–15.5)
WBC: 11 10*3/uL — ABNORMAL HIGH (ref 4.0–10.5)
nRBC: 0 % (ref 0.0–0.2)

## 2021-12-27 LAB — TYPE AND SCREEN
ABO/RH(D): O POS
Antibody Screen: NEGATIVE

## 2021-12-27 MED ORDER — MISOPROSTOL 25 MCG QUARTER TABLET
25.0000 ug | ORAL_TABLET | Freq: Once | ORAL | Status: AC
  Administered 2021-12-27: 25 ug via VAGINAL
  Filled 2021-12-27: qty 1

## 2021-12-27 MED ORDER — ONDANSETRON HCL 4 MG/2ML IJ SOLN
4.0000 mg | Freq: Four times a day (QID) | INTRAMUSCULAR | Status: DC | PRN
  Administered 2021-12-28: 4 mg via INTRAVENOUS
  Filled 2021-12-27: qty 2

## 2021-12-27 MED ORDER — SODIUM CHLORIDE 0.9 % IV SOLN
5.0000 10*6.[IU] | Freq: Once | INTRAVENOUS | Status: AC
  Administered 2021-12-28: 5 10*6.[IU] via INTRAVENOUS
  Filled 2021-12-27: qty 5

## 2021-12-27 MED ORDER — LIDOCAINE HCL (PF) 1 % IJ SOLN: 30.0000 mL | INTRAMUSCULAR | Status: DC | PRN

## 2021-12-27 MED ORDER — OXYTOCIN BOLUS FROM INFUSION
333.0000 mL | Freq: Once | INTRAVENOUS | Status: AC
  Administered 2021-12-29: 333 mL via INTRAVENOUS

## 2021-12-27 MED ORDER — SOD CITRATE-CITRIC ACID 500-334 MG/5ML PO SOLN
30.0000 mL | ORAL | Status: DC | PRN
  Administered 2021-12-27: 30 mL via ORAL
  Filled 2021-12-27: qty 30

## 2021-12-27 MED ORDER — LACTATED RINGERS IV SOLN
500.0000 mL | INTRAVENOUS | Status: DC | PRN
  Administered 2021-12-28: 500 mL via INTRAVENOUS

## 2021-12-27 MED ORDER — PENICILLIN G POT IN DEXTROSE 60000 UNIT/ML IV SOLN
3.0000 10*6.[IU] | INTRAVENOUS | Status: DC
  Administered 2021-12-28 (×5): 3 10*6.[IU] via INTRAVENOUS
  Filled 2021-12-27 (×5): qty 50

## 2021-12-27 MED ORDER — ACETAMINOPHEN 325 MG PO TABS: 650.0000 mg | ORAL_TABLET | ORAL | Status: DC | PRN

## 2021-12-27 MED ORDER — MISOPROSTOL 50MCG HALF TABLET
50.0000 ug | ORAL_TABLET | Freq: Once | ORAL | Status: AC
  Administered 2021-12-27: 50 ug via ORAL
  Filled 2021-12-27: qty 1

## 2021-12-27 MED ORDER — FENTANYL CITRATE (PF) 100 MCG/2ML IJ SOLN
50.0000 ug | INTRAMUSCULAR | Status: DC | PRN
  Administered 2021-12-28: 100 ug via INTRAVENOUS
  Filled 2021-12-27: qty 2

## 2021-12-27 MED ORDER — OXYCODONE-ACETAMINOPHEN 5-325 MG PO TABS: 2.0000 | ORAL_TABLET | ORAL | Status: DC | PRN

## 2021-12-27 MED ORDER — TERBUTALINE SULFATE 1 MG/ML IJ SOLN: 0.2500 mg | Freq: Once | INTRAMUSCULAR | Status: DC | PRN

## 2021-12-27 MED ORDER — OXYCODONE-ACETAMINOPHEN 5-325 MG PO TABS: 1.0000 | ORAL_TABLET | ORAL | Status: DC | PRN

## 2021-12-27 MED ORDER — OXYTOCIN-SODIUM CHLORIDE 30-0.9 UT/500ML-% IV SOLN: 2.5000 [IU]/h | INTRAVENOUS | Status: DC

## 2021-12-27 MED ORDER — LACTATED RINGERS IV SOLN
INTRAVENOUS | Status: DC
  Administered 2021-12-28: 1000 mL via INTRAVENOUS

## 2021-12-27 NOTE — H&P (Addendum)
OBSTETRIC ADMISSION HISTORY AND PHYSICAL  Kimberly Horton is a 26 y.o. female G1P0000 with IUP at [redacted]w[redacted]d presenting for IOL due to [place]. She reports +FMs. No LOF, VB, blurry vision, headaches, peripheral edema, or RUQ pain. Denies s/s of outbreak. She plans on breastfeeding. She is unsure regarding contraception. She has been followed by the [facility] since 15.6wks; she was evaluated for tachycardia/SOB @ 32wks and was noted to have PVCs; at 36wks was noted to have mild range BP elevations with MFM visit scheduled.  Dating: By 9.0wk u/s --->  Estimated Date of Delivery: 01/16/22  Sono:    @[redacted]w[redacted]d , normal anatomy, cephalic presentation, 6384T, 47%ile, EFW 5+3   Prenatal History/Complications: # onset of care at [facility] @ 16wks; first elevated BP @ 32wks then again at 36.0wks; seen by MFM today where determination was made for induction as she is now 37.0wks  Past Medical History: Past Medical History:  Diagnosis Date   Gestational hypertension 12/27/2021   Vitiligo     Past Surgical History: Past Surgical History:  Procedure Laterality Date   CYST REMOVAL NECK Left 2021    Obstetrical History: OB History     Gravida  1   Para  0   Term  0   Preterm  0   AB  0   Living  0      SAB  0   IAB  0   Ectopic  0   Multiple  0   Live Births  0           Social History: Social History   Socioeconomic History   Marital status: Single    Spouse name: Not on file   Number of children: 0   Years of education: 12+   Highest education level: Some college, no degree  Occupational History   Not on file  Tobacco Use   Smoking status: Never    Passive exposure: Never   Smokeless tobacco: Never  Vaping Use   Vaping Use: Never used  Substance and Sexual Activity   Alcohol use: Not Currently    Comment: last ETOH - 04/2021   Drug use: Not Currently    Types: Marijuana    Comment: 5 years ago   Sexual activity: Yes    Comment: hx nexplanon- removed  10/2020  Other Topics Concern   Not on file  Social History Narrative   Not on file   Social Determinants of Health   Financial Resource Strain: Low Risk  (06/07/2021)   Overall Financial Resource Strain (CARDIA)    Difficulty of Paying Living Expenses: Not very hard  Food Insecurity: No Food Insecurity (12/27/2021)   Hunger Vital Sign    Worried About Running Out of Food in the Last Year: Never true    Ran Out of Food in the Last Year: Never true  Transportation Needs: No Transportation Needs (12/27/2021)   PRAPARE - Hydrologist (Medical): No    Lack of Transportation (Non-Medical): No  Physical Activity: Not on file  Stress: Not on file  Social Connections: Not on file    Family History: Family History  Problem Relation Age of Onset   Stroke Father    Diabetes Father    Hypertension Father    Anuerysm Father    Asthma Neg Hx    Cancer Neg Hx    Heart disease Neg Hx     Allergies: No Known Allergies  Medications Prior to Admission  Medication Sig Dispense Refill Last Dose   Prenatal Vit-Fe Fumarate-FA (MULTIVITAMIN-PRENATAL) 27-0.8 MG TABS tablet Take 1 tablet by mouth daily at 12 noon.        Review of Systems:  All systems reviewed and negative except as stated in HPI  PE: Weight (!) 141.2 kg, last menstrual period 02/16/2021. General appearance: alert, cooperative, and no distress Lungs: regular rate and effort Heart: regular rate  Abdomen: soft, non-tender Extremities: Homans sign is negative, no sign of DVT Presentation: cephalic EFM: 150s bpm, + variability, + accels, no decels Toco: rare ctx   SVE: (will be done after her shower with cytotec placement most likely)  Prenatal labs: ABO, Rh: O/Positive/-- (03/22 1500) Antibody: Negative (03/22 1500) Rubella:  (not in chart) RPR: Non Reactive (08/08 1032)  HBsAg: Negative (03/22 1500)  HIV:   NR (10/24/21) GBS: Positive/-- (10/03 1636)  1 hr glucola 129  Prenatal  Transfer Tool  Maternal Diabetes: No Genetic Screening: Normal Maternal Ultrasounds/Referrals: Normal Fetal Ultrasounds or other Referrals:  Referred to Materal Fetal Medicine  Maternal Substance Abuse:  No Significant Maternal Medications:  None Significant Maternal Lab Results: Group B Strep positive  No results found for this or any previous visit (from the past 24 hour(s)).  Patient Active Problem List   Diagnosis Date Noted   Gestational hypertension 12/27/2021   Positive GBS test 12/22/2021   Hx herpes 10/24/2021   UTI (urinary tract infection) dx'd 09/27/21 [facility] 10/24/2021   tachycardia, P=149, 135, 121 09/27/2021   History of panic attacks age 48 08/29/2021   Obesity affecting pregnancy BMI=39.7 06/07/2021   Late prenatal care @ 16 1/7 06/07/2021   Encounter for supervision of normal first pregnancy in second trimester 06/07/2021   Vitiligo at age 73 06/07/2021    Assessment: Kimberly Horton is a 26 y.o. G1P0000 at [redacted]w[redacted]d here for IOL due to gHTN  1. Labor: plan cervical ripening to start with cytotec and/or cervical foley, then progress to Pit/AROM prn 2. FWB: Category 1 tracing 3. Pain: unsure 4. GBS: positive> begin PCN w active labor/Pitocin/ROM   Plan: Admit to Labor & Delivery Begin with induction (foley/cytotec) Collect pre-e labs Anticipate vag del  [name], CNM  12/27/2021, 6:34 PM

## 2021-12-27 NOTE — Progress Notes (Signed)
MFM Consult Note Patient Name: Kimberly Horton  Patient MRN:   [id number]  Referring provider: [facility]  Reason for Consult: R/o preE   HPI: [facility] EREN NICOLE Ashkar is a 26 y.o. G1P0000 at [redacted]w[redacted]d here for ultrasound and consultation.   Patient was seen in the office today for a biophysical profile.  Her blood pressure was noted to be elevated at 135/97 followed by 147/84.  She also reports persistent headache the past few days as well as seeing spots in her vision.  She also notes some increased lower extremity swelling.  She also has some increased shortness of breath.  She was admitted to the hospital in early September for palpitations.  A Holter monitor showed increased PACs and PVCs but no other arrhythmia.  She was scheduled for an echo this Friday but may be delivered prior to that time if she was not preeclampsia or gestational hypertension.  Review of Systems: A review of systems was performed and was negative except per HPI   Vitals and Physical Exam See intake sheet for vitals Sitting comfortably on the sonogram table Nonlabored breathing Normal rate and rhythm Abdomen is nontender  Sonographic findings Single intrauterine pregnancy. Observed fetal cardiac activity. Cephalic presentation. Interval fetal anatomy appears normal.  Amniotic fluid volume: Within normal limits. AFI: 8. cm.  MVP: 3.7 cm. Placenta is posterior. BPP is 8/8.   Assessment -Gestational hypertension versus preeclampsia Plan -To the MAU for serial blood pressure monitoring, laboratory work and consideration of delivery.  The patient that if her blood pressure is elevated or neurologic symptoms persist when she arrives to the MAU that delivery is indicated.  I also called Dr. Elonda Husky to notify him as well.  I spent 30 minutes reviewing the patients chart, including labs and images as well as counseling the patient about her medical conditions.  Valeda Malm  MFM, [place]   12/27/2021   4:43 PM

## 2021-12-28 ENCOUNTER — Ambulatory Visit: Payer: Medicaid Other

## 2021-12-28 ENCOUNTER — Inpatient Hospital Stay (HOSPITAL_COMMUNITY): Payer: Medicaid Other | Admitting: Anesthesiology

## 2021-12-28 LAB — CBC
HCT: 36.2 % (ref 36.0–46.0)
Hemoglobin: 12.2 g/dL (ref 12.0–15.0)
MCH: 28.2 pg (ref 26.0–34.0)
MCHC: 33.7 g/dL (ref 30.0–36.0)
MCV: 83.6 fL (ref 80.0–100.0)
Platelets: 268 10*3/uL (ref 150–400)
RBC: 4.33 MIL/uL (ref 3.87–5.11)
RDW: 15.8 % — ABNORMAL HIGH (ref 11.5–15.5)
WBC: 13.3 10*3/uL — ABNORMAL HIGH (ref 4.0–10.5)
nRBC: 0 % (ref 0.0–0.2)

## 2021-12-28 LAB — RPR: RPR Ser Ql: NONREACTIVE

## 2021-12-28 MED ORDER — MISOPROSTOL 50MCG HALF TABLET
50.0000 ug | ORAL_TABLET | Freq: Once | ORAL | Status: AC
  Administered 2021-12-28: 50 ug via BUCCAL
  Filled 2021-12-28: qty 1

## 2021-12-28 MED ORDER — DIPHENHYDRAMINE HCL 50 MG/ML IJ SOLN: 12.5000 mg | INTRAMUSCULAR | Status: DC | PRN

## 2021-12-28 MED ORDER — LIDOCAINE HCL (PF) 1 % IJ SOLN
INTRAMUSCULAR | Status: DC | PRN
  Administered 2021-12-28: 10 mL via EPIDURAL
  Administered 2021-12-28: 2 mL via EPIDURAL

## 2021-12-28 MED ORDER — LACTATED RINGERS IV SOLN
500.0000 mL | Freq: Once | INTRAVENOUS | Status: AC
  Administered 2021-12-28: 500 mL via INTRAVENOUS

## 2021-12-28 MED ORDER — PHENYLEPHRINE 80 MCG/ML (10ML) SYRINGE FOR IV PUSH (FOR BLOOD PRESSURE SUPPORT)
80.0000 ug | PREFILLED_SYRINGE | INTRAVENOUS | Status: DC | PRN
  Filled 2021-12-28: qty 10

## 2021-12-28 MED ORDER — PHENYLEPHRINE 80 MCG/ML (10ML) SYRINGE FOR IV PUSH (FOR BLOOD PRESSURE SUPPORT): 80.0000 ug | PREFILLED_SYRINGE | INTRAVENOUS | Status: DC | PRN

## 2021-12-28 MED ORDER — EPHEDRINE 5 MG/ML INJ: 10.0000 mg | INTRAVENOUS | Status: DC | PRN

## 2021-12-28 MED ORDER — OXYTOCIN-SODIUM CHLORIDE 30-0.9 UT/500ML-% IV SOLN
1.0000 m[IU]/min | INTRAVENOUS | Status: DC
  Administered 2021-12-28: 2 m[IU]/min via INTRAVENOUS
  Filled 2021-12-28 (×2): qty 500

## 2021-12-28 MED ORDER — FENTANYL-BUPIVACAINE-NACL 0.5-0.125-0.9 MG/250ML-% EP SOLN
EPIDURAL | Status: DC | PRN
  Administered 2021-12-28: 12 mL/h via EPIDURAL

## 2021-12-28 MED ORDER — FENTANYL-BUPIVACAINE-NACL 0.5-0.125-0.9 MG/250ML-% EP SOLN
12.0000 mL/h | EPIDURAL | Status: DC | PRN
  Filled 2021-12-28: qty 250

## 2021-12-28 NOTE — Anesthesia Procedure Notes (Signed)
Epidural Patient location during procedure: OB Start time: 12/28/2021 7:52 AM End time: 12/28/2021 8:02 AM  Staffing Anesthesiologist: Pervis Hocking, DO Performed: anesthesiologist   Preanesthetic Checklist Completed: patient identified, IV checked, risks and benefits discussed, monitors and equipment checked, pre-op evaluation and timeout performed  Epidural Patient position: sitting Prep: DuraPrep and site prepped and draped Patient monitoring: continuous pulse ox, blood pressure, heart rate and cardiac monitor Approach: midline Location: L3-L4 Injection technique: LOR air  Needle:  Needle type: Tuohy  Needle gauge: 17 G Needle length: 9 cm Needle insertion depth: 9 cm Catheter type: closed end flexible Catheter size: 19 Gauge Catheter at skin depth: 15 cm Test dose: negative  Assessment Sensory level: T8 Events: blood not aspirated, injection not painful, no injection resistance, no paresthesia and negative IV test  Additional Notes Patient identified. Risks/Benefits/Options discussed with patient including but not limited to bleeding, infection, nerve damage, paralysis, failed block, incomplete pain control, headache, blood pressure changes, nausea, vomiting, reactions to medication both or allergic, itching and postpartum back pain. Confirmed with bedside nurse the patient's most recent platelet count. Confirmed with patient that they are not currently taking any anticoagulation, have any bleeding history or any family history of bleeding disorders. Patient expressed understanding and wished to proceed. All questions were answered. Sterile technique was used throughout the entire procedure. Please see nursing notes for vital signs. Test dose was given through epidural catheter and negative prior to continuing to dose epidural or start infusion. Warning signs of high block given to the patient including shortness of breath, tingling/numbness in hands, complete motor  block, or any concerning symptoms with instructions to call for help. Patient was given instructions on fall risk and not to get out of bed. All questions and concerns addressed with instructions to call with any issues or inadequate analgesia.  Reason for block:procedure for pain

## 2021-12-28 NOTE — Progress Notes (Signed)
LABOR PROGRESS NOTE  Kimberly Horton is a 26 y.o. G1P0000 at [redacted]w[redacted]d  admitted for IOL for gHTN.  Subjective: Comfortable at this time. Is feeling her contractions   Objective: BP 117/74   Pulse (!) 102   Temp 97.8 F (36.6 C) (Oral)   Resp 16   Ht 5\' 11"  (1.803 m)   Wt (!) 141.2 kg   LMP 02/16/2021 (Exact Date)   BMI 43.42 kg/m  or  Vitals:   12/27/21 2322 12/28/21 0047 12/28/21 0142 12/28/21 0326  BP: 114/62 (!) 142/87 103/61 117/74  Pulse: (!) 112 (!) 111 (!) 104 (!) 102  Resp: 16 16 16 16   Temp: 97.8 F (36.6 C) 97.8 F (36.6 C)    TempSrc: Oral Oral    Weight:      Height:       Dilation: 1 Effacement (%): 70 Cervical Position: Posterior Station: -1 Presentation: Vertex Exam by:: Dr. Caron Presume FHT: baseline rate 150, moderate varibility, +acel, nodecel Toco: difficulty tracing   Labs: Lab Results  Component Value Date   WBC 11.0 (H) 12/27/2021   HGB 11.8 (L) 12/27/2021   HCT 36.3 12/27/2021   MCV 84.0 12/27/2021   PLT 292 12/27/2021    Patient Active Problem List   Diagnosis Date Noted   Gestational hypertension 12/27/2021   Positive GBS test 12/22/2021   Hx herpes 10/24/2021   UTI (urinary tract infection) dx'd 09/27/21 [facility] 10/24/2021   tachycardia, P=149, 135, 121 09/27/2021   History of panic attacks age 41 08/29/2021   Obesity affecting pregnancy BMI=39.7 06/07/2021   Late prenatal care @ 16 1/7 06/07/2021   Encounter for supervision of normal first pregnancy in second trimester 06/07/2021   Vitiligo at age 77 06/07/2021    Assessment / Plan: 26 y.o. G1P0000 at [redacted]w[redacted]d here for IOL for gHTN   Labor: Progressing well. FB placed at this check, 50 buccal cytotec given.  Fetal Wellbeing:  Cat 1 Pain Control:  Per pt request  Anticipated MOD:  Vaginal  gHTN: neg pre E labs, no severe range Bps   Gifford Shave, MD  12/28/2021, 7:02 AM

## 2021-12-28 NOTE — Progress Notes (Signed)
Labor Progress Note Kimberly Horton is a 26 y.o. G1P0000 at [redacted]w[redacted]d presented for IOL for gHTN  S:  Patient comfortable with epidural  O:  BP (!) 105/57   Pulse 65   Temp 97.8 F (36.6 C) (Oral)   Resp 16   Ht 5\' 11"  (1.803 m)   Wt (!) 141.2 kg   LMP 02/16/2021 (Exact Date)   SpO2 100%   BMI 43.42 kg/m   Fetal Tracing:  Baseline: 145 Variability: moderate Accels: 15x15 Decels: none  Toco: 5-10   CVE: Dilation: 5 Effacement (%): 90 Cervical Position: Posterior Station: -2 Presentation: Vertex Exam by:: August Albino RNC   A&P: 26 y.o. G1P0000 [redacted]w[redacted]d IOL for gHTN #Labor: Progressing well. Will reassess contraction pattern at 1000 and consider pitocin augmentation at that time #Pain: epidural #FWB: Cat 1 #GBS positive  Wende Mott, CNM 9:31 AM

## 2021-12-28 NOTE — Progress Notes (Signed)
LABOR PROGRESS NOTE  Kimberly Horton is a 26 y.o. G1P0000 at [redacted]w[redacted]d  admitted for IOL for gHTN.  Subjective: Resting comfortably, feeling contractions   Objective: BP 114/62   Pulse (!) 112   Temp 97.8 F (36.6 C) (Oral)   Resp 16   Ht 5\' 11"  (1.803 m)   Wt (!) 141.2 kg   LMP 02/16/2021 (Exact Date)   BMI 43.42 kg/m  or  Vitals:   12/27/21 2011 12/27/21 2040 12/27/21 2131 12/27/21 2322  BP: 121/74 133/82 112/73 114/62  Pulse: (!) 114 91 (!) 109 (!) 112  Resp: 16 16  16   Temp:  98.1 F (36.7 C)  97.8 F (36.6 C)  TempSrc:  Oral  Oral  Weight:      Height:       Dilation: Fingertip Effacement (%): Thick Cervical Position: Posterior Station: -2 Presentation: Vertex Exam by:: Amrit Erck, MD FHT: baseline rate 150, moderate varibility, +acel, nodecel Toco: difficulty tracing   Labs: Lab Results  Component Value Date   WBC 11.0 (H) 12/27/2021   HGB 11.8 (L) 12/27/2021   HCT 36.3 12/27/2021   MCV 84.0 12/27/2021   PLT 292 12/27/2021    Patient Active Problem List   Diagnosis Date Noted   Gestational hypertension 12/27/2021   Positive GBS test 12/22/2021   Hx herpes 10/24/2021   UTI (urinary tract infection) dx'd 09/27/21 [facility] 10/24/2021   tachycardia, P=149, 135, 121 09/27/2021   History of panic attacks age 48 08/29/2021   Obesity affecting pregnancy BMI=39.7 06/07/2021   Late prenatal care @ 16 1/7 06/07/2021   Encounter for supervision of normal first pregnancy in second trimester 06/07/2021   Vitiligo at age 61 06/07/2021    Assessment / Plan: 26 y.o. G1P0000 at [redacted]w[redacted]d here for IOL for gHTN   Labor: Progressing well. Unsuccessful FB attempt. With redose cytotec and reattempt at next check.  Fetal Wellbeing:  Cat 1 Pain Control:  Per pt request  Anticipated MOD:  Vaginal  gHTN: neg pre E labs, no severe range Bps   Gifford Shave, MD  12/28/2021, 12:51 AM

## 2021-12-28 NOTE — Anesthesia Preprocedure Evaluation (Signed)
Anesthesia Evaluation  Patient identified by MRN, date of birth, ID band Patient awake    Reviewed: Allergy & Precautions, NPO status , Patient's Chart, lab work & pertinent test results  Airway Mallampati: III  TM Distance: >3 FB Neck ROM: Full    Dental no notable dental hx.    Pulmonary neg pulmonary ROS,    Pulmonary exam normal breath sounds clear to auscultation       Cardiovascular hypertension (gest HTN), Normal cardiovascular exam Rhythm:Regular Rate:Normal     Neuro/Psych negative neurological ROS  negative psych ROS   GI/Hepatic negative GI ROS, Neg liver ROS,   Endo/Other  Morbid obesityBMI 43  Renal/GU negative Renal ROS  negative genitourinary   Musculoskeletal negative musculoskeletal ROS (+)   Abdominal (+) + obese,   Peds negative pediatric ROS (+)  Hematology Hb 12.2, plt 268   Anesthesia Other Findings   Reproductive/Obstetrics negative OB ROS                             Anesthesia Physical Anesthesia Plan  ASA: 3  Anesthesia Plan: Epidural   Post-op Pain Management:    Induction:   PONV Risk Score and Plan: 2  Airway Management Planned: Natural Airway  Additional Equipment: None  Intra-op Plan:   Post-operative Plan:   Informed Consent: I have reviewed the patients History and Physical, chart, labs and discussed the procedure including the risks, benefits and alternatives for the proposed anesthesia with the patient or authorized representative who has indicated his/her understanding and acceptance.       Plan Discussed with:   Anesthesia Plan Comments:         Anesthesia Quick Evaluation

## 2021-12-28 NOTE — Progress Notes (Signed)
Labor Progress Note Kimberly Horton Kimberly Horton is a 26 y.o. G1P0000 at [redacted]w[redacted]d presented for IOL for gHTN.  S: Patient is resting comfortably. Still able to move around some in bed.  O:  BP (!) 105/57   Pulse 65   Temp 97.8 F (36.6 C) (Oral)   Resp 16   Ht 5\' 11"  (1.803 m)   Wt (!) 141.2 kg   LMP 02/16/2021 (Exact Date)   SpO2 100%   BMI 43.42 kg/m  EFM: 135/mod/+accels, no decels  CVE: Dilation: 5 Effacement (%): 90 Cervical Position: Posterior Station: -2 Presentation: Vertex Exam by:: August Albino RNC   A&P: 26 y.o. G1P0000 108w2d here for IOL for gHTN. #Labor: Progressing well. No cervical change since last check. Start pitocin and consider AROM at next check. #Pain: well controlled with epidural #FWB: category 1 #GBS positive, on PCN gHTN with negative PEC labs. No severe BP.   Cecilio Asper, MD [facility] 10:43 AM

## 2021-12-28 NOTE — Progress Notes (Signed)
Labor Progress Note Kenneshia Eren Jarely Juncaj is a 26 y.o. G1P0000 at [redacted]w[redacted]d presented for IOL for gHTN  S:  Comfortable with epidural  O:  BP (!) 120/59   Pulse (!) 133   Temp (!) 97.4 F (36.3 C) (Oral)   Resp 16   Ht 5\' 11"  (1.803 m)   Wt (!) 141.2 kg   LMP 02/16/2021 (Exact Date)   SpO2 100%   BMI 43.42 kg/m   Fetal Tracing:  Baseline: 140 Variability: moderate Accels: 15x15 Decels: none  Toco: 2-4   CVE: Dilation: 6 Effacement (%): 60 Cervical Position: Posterior Station: -2 Presentation: Vertex Exam by:: Len Blalock, CNM   A&P: 26 y.o. G1P0000 [redacted]w[redacted]d IOL gHTN #Labor: Progressing well. Discussed with patient risks and benefits of AROM for augmentation of labor. Patient agreeable to plan of care. AROM with moderate amount of clear fluid. Patient and FHR tolerated procedure well. Will titrate pitocin as able. #Pain: epidural #FWB: Cat 1 #GBS positive PCN  Wende Mott, CNM 3:06 PM

## 2021-12-28 NOTE — Progress Notes (Signed)
Labor Progress Note Merida Eren Seth Friedlander is a 26 y.o. G1P0000 at [redacted]w[redacted]d presented for IOL for gHTN  S:  Patient comfortable with epidural  O:  BP (!) 111/44   Pulse (!) 104   Temp (!) 97.4 F (36.3 C) (Oral)   Resp 16   Ht 5\' 11"  (1.803 m)   Wt (!) 141.2 kg   LMP 02/16/2021 (Exact Date)   SpO2 100%   BMI 43.42 kg/m   Fetal Tracing:  Baseline: 130 Variability: moderate Accels: 15x15 Decels: none  Toco: unable to trace accurately   CVE: Dilation: 6 Effacement (%): 60 Cervical Position: Posterior Station: -2 Presentation: Vertex Exam by:: Haynes Bast CNM   A&P: 26 y.o. G1P0000 [redacted]w[redacted]d IOL gHTN #Labor: Cervix unchanged but difficulty titrating pitocin due to inability to trace UCs. Discussed risks and benefits of IUPC placement for monitoring contractions more accurately. Patient agreeable to plan of care. Internal monitors placed without difficulty and patient tolerated procedure well.   #Pain: epidural #FWB: Cat 1 #GBS positive  Wende Mott, CNM 5:48 PM

## 2021-12-29 ENCOUNTER — Ambulatory Visit: Payer: Medicaid Other

## 2021-12-29 DIAGNOSIS — Z3A37 37 weeks gestation of pregnancy: Secondary | ICD-10-CM

## 2021-12-29 DIAGNOSIS — O134 Gestational [pregnancy-induced] hypertension without significant proteinuria, complicating childbirth: Secondary | ICD-10-CM

## 2021-12-29 DIAGNOSIS — O99824 Streptococcus B carrier state complicating childbirth: Secondary | ICD-10-CM

## 2021-12-29 DIAGNOSIS — Z3403 Encounter for supervision of normal first pregnancy, third trimester: Secondary | ICD-10-CM | POA: Diagnosis not present

## 2021-12-29 LAB — CBC
HCT: 33.9 % — ABNORMAL LOW (ref 36.0–46.0)
Hemoglobin: 11.4 g/dL — ABNORMAL LOW (ref 12.0–15.0)
MCH: 27.9 pg (ref 26.0–34.0)
MCHC: 33.6 g/dL (ref 30.0–36.0)
MCV: 82.9 fL (ref 80.0–100.0)
Platelets: 258 10*3/uL (ref 150–400)
RBC: 4.09 MIL/uL (ref 3.87–5.11)
RDW: 15.4 % (ref 11.5–15.5)
WBC: 13.3 10*3/uL — ABNORMAL HIGH (ref 4.0–10.5)
nRBC: 0 % (ref 0.0–0.2)

## 2021-12-29 MED ORDER — FUROSEMIDE 20 MG PO TABS
20.0000 mg | ORAL_TABLET | Freq: Every day | ORAL | Status: DC
  Administered 2021-12-29 – 2021-12-31 (×3): 20 mg via ORAL
  Filled 2021-12-29 (×3): qty 1

## 2021-12-29 MED ORDER — SIMETHICONE 80 MG PO CHEW: 80.0000 mg | CHEWABLE_TABLET | ORAL | Status: DC | PRN

## 2021-12-29 MED ORDER — DIBUCAINE (PERIANAL) 1 % EX OINT: 1.0000 | TOPICAL_OINTMENT | CUTANEOUS | Status: DC | PRN

## 2021-12-29 MED ORDER — BENZOCAINE-MENTHOL 20-0.5 % EX AERO
1.0000 | INHALATION_SPRAY | CUTANEOUS | Status: DC | PRN
  Administered 2021-12-29: 1 via TOPICAL
  Filled 2021-12-29: qty 56

## 2021-12-29 MED ORDER — TETANUS-DIPHTH-ACELL PERTUSSIS 5-2.5-18.5 LF-MCG/0.5 IM SUSY: 0.5000 mL | PREFILLED_SYRINGE | Freq: Once | INTRAMUSCULAR | Status: DC

## 2021-12-29 MED ORDER — ONDANSETRON HCL 4 MG/2ML IJ SOLN: 4.0000 mg | INTRAMUSCULAR | Status: DC | PRN

## 2021-12-29 MED ORDER — PRENATAL MULTIVITAMIN CH
1.0000 | ORAL_TABLET | Freq: Every day | ORAL | Status: DC
  Administered 2021-12-29 – 2021-12-30 (×2): 1 via ORAL
  Filled 2021-12-29 (×2): qty 1

## 2021-12-29 MED ORDER — IBUPROFEN 600 MG PO TABS
600.0000 mg | ORAL_TABLET | Freq: Four times a day (QID) | ORAL | Status: DC
  Administered 2021-12-29 – 2021-12-31 (×9): 600 mg via ORAL
  Filled 2021-12-29 (×9): qty 1

## 2021-12-29 MED ORDER — DIPHENHYDRAMINE HCL 25 MG PO CAPS: 25.0000 mg | ORAL_CAPSULE | Freq: Four times a day (QID) | ORAL | Status: DC | PRN

## 2021-12-29 MED ORDER — ZOLPIDEM TARTRATE 5 MG PO TABS: 5.0000 mg | ORAL_TABLET | Freq: Every evening | ORAL | Status: DC | PRN

## 2021-12-29 MED ORDER — MEASLES, MUMPS & RUBELLA VAC IJ SOLR: 0.5000 mL | Freq: Once | INTRAMUSCULAR | Status: DC

## 2021-12-29 MED ORDER — COCONUT OIL OIL: 1.0000 | TOPICAL_OIL | Status: DC | PRN

## 2021-12-29 MED ORDER — WITCH HAZEL-GLYCERIN EX PADS: 1.0000 | MEDICATED_PAD | CUTANEOUS | Status: DC | PRN

## 2021-12-29 MED ORDER — ACETAMINOPHEN 325 MG PO TABS
650.0000 mg | ORAL_TABLET | ORAL | Status: DC | PRN
  Administered 2021-12-30 – 2021-12-31 (×3): 650 mg via ORAL
  Filled 2021-12-29 (×4): qty 2

## 2021-12-29 MED ORDER — ONDANSETRON HCL 4 MG PO TABS: 4.0000 mg | ORAL_TABLET | ORAL | Status: DC | PRN

## 2021-12-29 MED ORDER — SENNOSIDES-DOCUSATE SODIUM 8.6-50 MG PO TABS
2.0000 | ORAL_TABLET | ORAL | Status: DC
  Administered 2021-12-30: 2 via ORAL
  Filled 2021-12-29: qty 2

## 2021-12-29 NOTE — Lactation Note (Signed)
This note was copied from a baby's chart. Lactation Consultation Note  Patient Name: Kimberly Horton [id number] Date: 12/29/2021 Reason for consult: Initial assessment;1st time breastfeeding;Early term 37-38.6wks Age:25 hours  P1, [redacted]w[redacted]d.  Unwrapped baby for feeding.  Reviewed hand expression and provided education. Baby sleepy at the breast and did not sustain latch at this time.  Encouraged skin to skin. Feed on demand with cues.  Goal 8-12+ times per day after first 24 hrs.  Place baby STS if not cueing.  Suggest calling for help as needed. Lactation brochure given.   Maternal Data Has patient been taught Hand Expression?: Yes Does the patient have breastfeeding experience prior to this delivery?: No  Feeding Mother's Current Feeding Choice: Breast Milk  LATCH Score Latch: Too sleepy or reluctant, no latch achieved, no sucking elicited.  Audible Swallowing: None  Type of Nipple: Everted at rest and after stimulation  Comfort (Breast/Nipple): Soft / non-tender  Hold (Positioning): Assistance needed to correctly position infant at breast and maintain latch.  LATCH Score: 5  Interventions Interventions: Breast feeding basics reviewed;Assisted with latch;Skin to skin;Hand express;Education;LC Services brochure  Consult Status Consult Status: Follow-up Date: 12/30/21 Follow-up type: In-patient    Vivianne Master [facility] 12/29/2021, 8:39 AM

## 2021-12-29 NOTE — Progress Notes (Signed)
Spoke with Armando Gang, CNM regarding pt's complaint of chest pain. She describes it as sharp when lying/semi-fowlers position but relieved when sitting up. Pt states it is different than acid reflux. She told this RN she originally had a cardiologist appointment today but canceled due to delivery. Pt appears comfortable. No new orders at this time, only to call MD back for any worsening changes.

## 2021-12-29 NOTE — Anesthesia Postprocedure Evaluation (Signed)
Anesthesia Post Note  Patient: Kimberly Horton  Procedure(s) Performed: AN AD [place]     Patient location during evaluation: Mother Baby Anesthesia Type: Epidural Level of consciousness: awake and alert Pain management: pain level controlled Vital Signs Assessment: post-procedure vital signs reviewed and stable Respiratory status: spontaneous breathing, nonlabored ventilation and respiratory function stable Cardiovascular status: stable Postop Assessment: no headache, no backache, epidural receding, no apparent nausea or vomiting, patient able to bend at knees, adequate PO intake and able to ambulate Anesthetic complications: no   No notable events documented.  Last Vitals:  Vitals:   12/29/21 0443 12/29/21 0532  BP: 128/74 124/75  Pulse: (!) 105 (!) 108  Resp: 18 18  Temp: 37.2 C 37.1 C  SpO2: 98% 99%    Last Pain:  Vitals:   12/29/21 0532  TempSrc: Axillary  PainSc: 3    Pain Goal:                   [employer]

## 2021-12-29 NOTE — Discharge Summary (Signed)
Postpartum Discharge Summary  Date of Service updated***     Patient Name: Kimberly Horton DOB: 21-Aug-1995 MRN: [id number]  Date of admission: 12/27/2021 Delivery date:12/29/2021  Delivering provider: Concepcion Living  Date of discharge: 12/29/2021  Admitting diagnosis: Uterine contractions at greater than 20 weeks of gestation [O47.9] Intrauterine pregnancy: [redacted]w[redacted]d    Secondary diagnosis:  Active Problems:   Obesity affecting pregnancy BMI=39.7   tachycardia, P=149, 135, 121   Hx herpes   Positive GBS test   Gestational hypertension  Additional problems: ***    Discharge diagnosis: Term Pregnancy Delivered and Gestational Hypertension                                              Post partum procedures:{Postpartum procedures:23558} Augmentation: AROM, Pitocin, Cytotec, and IP Foley Complications: None  Hospital course: Induction of Labor With Vaginal Delivery   26y.o. yo G1P0000 at 356w3das admitted to the hospital 12/27/2021 for induction of labor.  Indication for induction: Gestational hypertension.  Patient had an uncomplicated labor course.  Membrane Rupture Time/Date: 2:41 PM ,12/28/2021   Delivery Method:Vaginal, Spontaneous  Episiotomy: None  Lacerations:  1st degree;Perineal;Labial;Periurethral  Details of delivery can be found in separate delivery note.  Patient had a postpartum course complicated by***. Patient is discharged home 12/29/21.  Newborn Data: Birth date:12/29/2021  Birth time:2:14 AM  Gender:Female  Living status:Living  Apgars:7 ,9  Weight:   Magnesium Sulfate received: {Mag received:30440022} BMZ received: {BMZ received:30440023} Rhophylac:{Rhophylac received:30440032} MMWHQ:{PRF:16384665}-DaP:{Tdap:23962} Flu: {F{LDJ:57017}ransfusion:{Transfusion received:30440034}  Physical exam  Vitals:   12/28/21 2332 12/29/21 0232 12/29/21 0247 12/29/21 0302  BP: 110/64 94/80 116/84 106/84  Pulse: 100  (!) 131   Resp:      Temp:  97.9 F (36.6 C)     TempSrc: Oral     SpO2:      Weight:      Height:       General: {Exam; general:21111117} Lochia: {Desc; appropriate/inappropriate:30686::"appropriate"} Uterine Fundus: {Desc; firm/soft:30687} Incision: {Exam; incision:21111123} DVT Evaluation: {Exam; dvt:2111122} Labs: Lab Results  Component Value Date   WBC 13.3 (H) 12/29/2021   HGB 11.4 (L) 12/29/2021   HCT 33.9 (L) 12/29/2021   MCV 82.9 12/29/2021   PLT 258 12/29/2021      Latest Ref Rng & Units 12/27/2021    6:58 PM  CMP  Glucose 70 - 99 mg/dL 136   BUN 6 - 20 mg/dL 6   Creatinine 0.44 - 1.00 mg/dL 0.54   Sodium 135 - 145 mmol/L 134   Potassium 3.5 - 5.1 mmol/L 3.7   Chloride 98 - 111 mmol/L 109   CO2 22 - 32 mmol/L 18   Calcium 8.9 - 10.3 mg/dL 8.7   Total Protein 6.5 - 8.1 g/dL 6.2   Total Bilirubin 0.3 - 1.2 mg/dL <0.1   Alkaline Phos 38 - 126 U/L 150   AST 15 - 41 U/L 24   ALT 0 - 44 U/L 19    Edinburgh Score:     No data to display           After visit meds:  Allergies as of 12/29/2021   No Known Allergies   [facility] completed [facility]        Discharge home in stable condition Infant Feeding: {Baby feeding:23562} Infant Disposition:{CHL  IP OB HOME WITH HTDSKA:76811} Discharge instruction: per After Visit Summary and Postpartum booklet. Activity: Advance as tolerated. Pelvic rest for 6 weeks.  Diet: {OB XBWI:20355974} Future Appointments: Future Appointments  Date Time Provider [place]  01/11/2022  8:00 AM Agbor-Etang, Aaron Edelman, MD [facility] None   Follow up Visit:   Please schedule this patient for a In person postpartum visit in 4 weeks with the following provider: Any provider. Additional Postpartum F/U:BP check 1 week  High risk pregnancy complicated by: HTN Delivery mode:  Vaginal, Spontaneous  Anticipated Birth Control:  Unsure   12/29/2021 Concepcion Living, MD

## 2021-12-29 NOTE — Lactation Note (Addendum)
This note was copied from a baby's chart. Lactation Consultation Note  Patient Name: Kimberly Horton [id number] Date: 12/29/2021 Reason for consult: Follow-up assessment;Early term 37-38.6wks;1st time breastfeeding Age:25 hours  P1, [redacted]w[redacted]d. Infant sleepy at the breast. Spoon fed and finger fed 5 ml of colostrum. Provided mother with manual pump. LC will return to set up DEBP to additional stimulation.  Returned to room and set up DEBP. Mother will attempt to feed w/ cues or wake baby at 2pm. Suggest to mother to consider supplementing with donor milk if baby continues to not latch.  Continue hand expressing, spoon feeding and pumping.    Maternal Data Has patient been taught Hand Expression?: Yes Does the patient have breastfeeding experience prior to this delivery?: No  Feeding Mother's Current Feeding Choice: Breast Milk  LATCH Score Latch: Too sleepy or reluctant, no latch achieved, no sucking elicited.  Audible Swallowing: None  Type of Nipple: Everted at rest and after stimulation  Comfort (Breast/Nipple): Soft / non-tender  Hold (Positioning): Assistance needed to correctly position infant at breast and maintain latch.  LATCH Score: 5   Lactation Tools Discussed/Used Tools: Pump Breast pump type: Manual Reason for Pumping: stimulation  Interventions Interventions: Assisted with latch;Breast feeding basics reviewed;Skin to skin;Hand express;Pre-pump if needed;Hand pump;Education   Consult Status Consult Status: Follow-up Date: 12/30/21 Follow-up type: In-patient    Vivianne Master [facility] 12/29/2021, 11:35 AM

## 2021-12-30 NOTE — Lactation Note (Signed)
This note was copied from a baby's chart. Lactation Consultation Note  Patient Name: Kimberly Horton Date: 12/30/2021 Reason for consult: Follow-up assessment;Mother's request;Difficult latch;Primapara;1st time breastfeeding;Early term 37-38.6wks;Infant weight loss;Breastfeeding assistance (5.2% WL) Age:25 hours  P1, Early Term, Infant Female, 5.2% WL  LC entered the room and the infant was being held by the supporting parent.  Per the birth parent, the infant has not been feeding very well.  She stated that she has been pumping, hand expressing, and spoon feeding.  LC did suck training with the infant and he was able to suck when I stimulated his sucking reflex.  LC assisted the birth parent in putting the infant to the left breast in the football position using a size #24 nipple shield.  The infant latched and sucked for 10 sec before coming off.  The infant latched with his tongue down, lips were flanged, and sucking was rhythmic.  He latched off and on for 5 min on the left breast.  The infant was switched to the right breast without a nipple shield.  LC showed the birth parent how to sandwich the tissue and roll the nipple.  The infant latched with tongue down, lips flanged, and rhythmic sucking.  The infant was off and on the right breast for 10 min and was still feeding when [place] left.  The birth parent is aware that the infant needs to have AT LEAST a 15 min feeding.  She is aware that if the infant does not have improved feedings, supplementation may be necessary.  LC encouraged the birth parent to call for breastfeeding assistance at the next feeding.   Maternal Data Does the patient have breastfeeding experience prior to this delivery?: No  Feeding Mother's Current Feeding Choice: Breast Milk and Formula  LATCH Score Latch: Repeated attempts needed to sustain latch, nipple held in mouth throughout feeding, stimulation needed to elicit sucking reflex.  Audible  Swallowing: A few with stimulation  Type of Nipple: Flat  Comfort (Breast/Nipple): Soft / non-tender  Hold (Positioning): Assistance needed to correctly position infant at breast and maintain latch.  LATCH Score: 6  Lactation Tools Discussed/Used Tools: Nipple Shields Nipple shield size: 24  Interventions Interventions: Breast feeding basics reviewed;Assisted with latch;Adjust position;Support pillows  Consult Status Consult Status: Follow-up Date: 12/31/21 Follow-up type: In-patient    Kimberly Horton 12/30/2021, 3:03 PM

## 2021-12-30 NOTE — Progress Notes (Addendum)
POSTPARTUM PROGRESS NOTE  Post Partum Day 1  Subjective:  Kimberly Horton is a 26 y.o. G1P1001 s/p VD at [redacted]w[redacted]d.  She reports she is doing well. No acute events overnight. She denies any problems with ambulating, voiding or po intake. Denies nausea or vomiting.  Pain is well controlled.  Lochia is adeqaute.  Objective: Blood pressure (!) 100/58, pulse (!) 107, temperature 97.8 F (36.6 C), temperature source Oral, resp. rate 16, height 5\' 11"  (1.803 m), weight (!) 141.2 kg, last menstrual period 02/16/2021, SpO2 98 %.  Physical Exam:  General: alert, cooperative and no distress Chest: no respiratory distress Heart:regular rate, distal pulses intact Uterine Fundus: firm, appropriately tender DVT Evaluation: No calf swelling or tenderness Extremities: no edema Skin: warm, dry  Recent Labs    12/28/21 0738 12/29/21 0247  HGB 12.2 11.4*  HCT 36.2 33.9*    Assessment/Plan: Indigo Eren Ayden Apodaca is a 26 y.o. G1P0000 s/p VD at [redacted]w[redacted]d   PPD#1 - Doing well  Routine postpartum care Contraception: declined Feeding: Breast and bottle Dispo: Plan for discharge tomorrow.   LOS: 3 days   Shelda Pal, DO OB Fellow  12/30/2021, 8:30 AM

## 2021-12-30 NOTE — Lactation Note (Signed)
This note was copied from a baby's chart. Lactation Consultation Note  Patient Name: Kimberly Horton [id number] Date: 12/30/2021 Reason for consult: Follow-up assessment;Primapara;1st time breastfeeding;Early term 37-38.6wks;Infant weight loss;Breastfeeding assistance (5.2% WL) Age:25 hours  P1, Term, Infant Female, 5.2% WL  LC entered the room and the infant was asleep with the birth parent.  The [place] assisted the birth parent with putting the infant to the breast on the left breast for 5 min.  The infant appeared to be turning yellow and LC suggested that we begin supplementation.  The birth parent agreed to supplement with DBM.  LC assisted the birth parent with feeding the infant 51mL of DBM using the SNS system.  The infant stayed at the breast for a total of 10 min.   Current Feeding Plan:  Breastfeed 8+ times in 24 hours according to feeding cues.  Put the infant to the breast before supplementing.  Supplement according to feeding guidelines. Pump after feedings. Call [place] for assistance with breastfeeding.   Maternal Data    Feeding Mother's Current Feeding Choice: Breast Milk and Formula  LATCH Score Latch: Grasps breast easily, tongue down, lips flanged, rhythmical sucking.  Audible Swallowing: Spontaneous and intermittent  Type of Nipple: Flat  Comfort (Breast/Nipple): Soft / non-tender  Hold (Positioning): Assistance needed to correctly position infant at breast and maintain latch.  LATCH Score: 8   Lactation Tools Discussed/Used Tools: Supplemental Nutrition System  Interventions Interventions: Breast feeding basics reviewed;Assisted with latch;Support pillows;Education  Discharge    Consult Status Consult Status: Follow-up Date: 12/31/21 Follow-up type: In-patient    Kimberly Horton 12/30/2021, 6:08 PM

## 2021-12-31 MED ORDER — SIMETHICONE 80 MG PO CHEW: 80.0000 mg | CHEWABLE_TABLET | ORAL | 0 refills | Status: AC | PRN

## 2021-12-31 MED ORDER — FUROSEMIDE 20 MG PO TABS: 20.0000 mg | ORAL_TABLET | Freq: Every day | ORAL | 0 refills | Status: AC

## 2021-12-31 MED ORDER — SENNOSIDES-DOCUSATE SODIUM 8.6-50 MG PO TABS: 2.0000 | ORAL_TABLET | ORAL | 0 refills | Status: AC

## 2021-12-31 MED ORDER — BENZOCAINE-MENTHOL 20-0.5 % EX AERO: 1.0000 | INHALATION_SPRAY | CUTANEOUS | 0 refills | Status: AC | PRN

## 2021-12-31 MED ORDER — WITCH HAZEL-GLYCERIN EX PADS: 1.0000 | MEDICATED_PAD | CUTANEOUS | 12 refills | Status: AC | PRN

## 2021-12-31 MED ORDER — ACETAMINOPHEN 325 MG PO TABS: 650.0000 mg | ORAL_TABLET | ORAL | 0 refills | Status: AC | PRN

## 2021-12-31 MED ORDER — IBUPROFEN 600 MG PO TABS: 600.0000 mg | ORAL_TABLET | Freq: Four times a day (QID) | ORAL | 0 refills | Status: AC

## 2021-12-31 NOTE — Lactation Note (Signed)
This note was copied from a baby's chart. Lactation Consultation Note  Patient Name: Kimberly Horton [id number] Date: 12/31/2021 Reason for consult: Follow-up assessment;Hyperbilirubinemia;1st time breastfeeding;Early term 37-38.6wks;Infant weight loss Age:25 hours  P1, Baby started on phototherapy. Attempted latching after recent donor milk feeding but baby was too sleepy.  Attempted without nipple shield. Mother is using #20NS on Left and #24NS on right side. Suggest offering breast before donor milk. Encouraged pumping q 3 hours and works towards increasing donor milk volume to 20-30 ml per feeding. Suggest calling for help with latching today as needed. Discussed considering outpatient appt after discharge to work on feeding.  Email sent and suggest mother ask if her Ped MD's office in [place] has a Science writer in their office.   Maternal Data Has patient been taught Hand Expression?: Yes Does the patient have breastfeeding experience prior to this delivery?: No  Feeding Mother's Current Feeding Choice: Breast Milk and Donor Milk  Lactation Tools Discussed/Used Tools: Pump;Nipple Shields Nipple shield size: 20;24 Breast pump type: Double-Electric Breast Pump Reason for Pumping: stimulation and supplementation Pumping frequency: q 3 hours Pumped volume:  (drops)  Interventions Interventions: Assisted with latch;Skin to skin;DEBP;Education  Discharge Discharge Education: Outpatient recommendation Pump: Personal;DEBP (unknown brand)  Consult Status Consult Status: Follow-up Date: 01/01/22 Follow-up type: In-patient    Vivianne Master [facility] 12/31/2021, 12:00 PM

## 2022-01-01 ENCOUNTER — Ambulatory Visit: Payer: Self-pay

## 2022-01-01 NOTE — Lactation Note (Signed)
This note was copied from a baby's chart. Lactation Consultation Note  Patient Name: Kimberly Horton Date: 01/01/2022 Reason for consult: Follow-up assessment;Early term 37-38.6wks;Primapara;1st time breastfeeding Age:25 days  P1; Early term infant at 37+3 weeks Feeding preference: Breast/donor breast milk Weight loss: 5%  Baby was sleeping next to birth parent when I arrived.  She has been primarily bottle feeding donor milk.  Birth parent has been using a nipple shield when she latches. Suggested she continue to latch every three hours or sooner if baby shows cues.  Use the nipple shield as desired. Supplement with expressed milk/formula to volumes of 30+ mls every three hours.  Wake baby for feedings if he does not self awaken.  Birth parent will continue to practice latching prior to giving supplementation.  She will continue to pump for 15 minutes after supplementation.  Suggested she follow up as desired with an op lactation consult; information given.  Birth parent appreciative.  No support person present at this time.     Maternal Data    Feeding Mother's Current Feeding Choice: Breast Milk and Donor Milk Nipple Type: Extra Slow Flow  LATCH Score                    Lactation Tools Discussed/Used    Interventions Interventions: Breast feeding basics reviewed;Education  Discharge Discharge Education: Outpatient recommendation (As needed) Pump: Personal  Consult Status Consult Status: Complete Date: 01/01/22 Follow-up type: Call as needed    Elizabet Schweppe R Tajah Noguchi 01/01/2022, 11:35 AM

## 2022-01-02 ENCOUNTER — Ambulatory Visit: Payer: Medicaid Other

## 2022-01-02 ENCOUNTER — Encounter (HOSPITAL_COMMUNITY): Payer: Self-pay | Admitting: Obstetrics and Gynecology

## 2022-01-09 ENCOUNTER — Telehealth (HOSPITAL_COMMUNITY): Payer: Self-pay

## 2022-01-09 NOTE — Telephone Encounter (Signed)
Patient did not answer phone call. Voicemail left for patient.   Sharyn Lull [facility] 01/09/2022,1317

## 2022-01-11 ENCOUNTER — Ambulatory Visit: Payer: Medicaid Other | Admitting: Cardiology

## 2022-02-13 ENCOUNTER — Ambulatory Visit: Payer: Medicaid Other

## 2022-02-13 ENCOUNTER — Telehealth: Payer: Self-pay | Admitting: Family Medicine

## 2022-02-13 NOTE — Telephone Encounter (Signed)
Phone encounter read and forwarded to K. Brewer-Jensen RN, [facility] Coordinator. Jossie Ng, RN

## 2022-02-13 NOTE — Telephone Encounter (Signed)
Pt called to cancel mh appt she moved back to [place]

## 2023-07-18 IMAGING — US US MFM OB DETAIL+14 WK
1 series · 13 of 28 positions shown · non-contrast
Comparison: none

[Series 1: us mfm ob detail+14 wk · 13 of 125 slices shown]
[im 5/125]
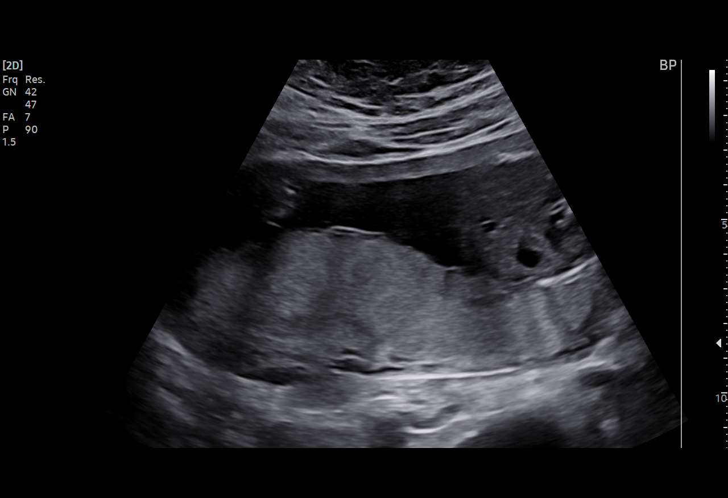
[im 14/125]
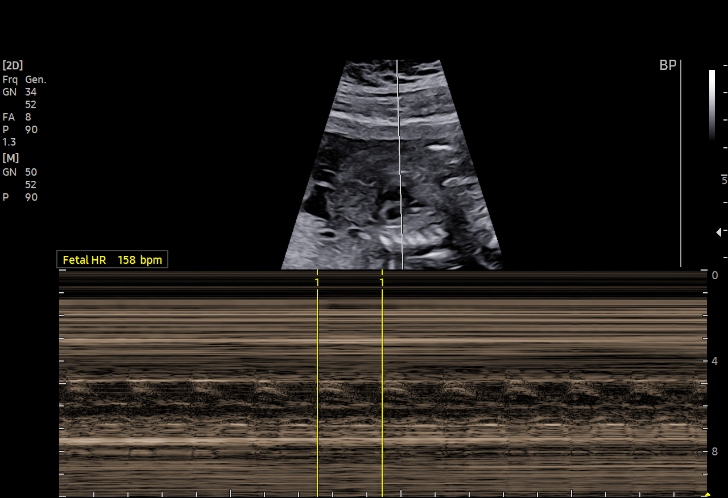
[im 23/125]
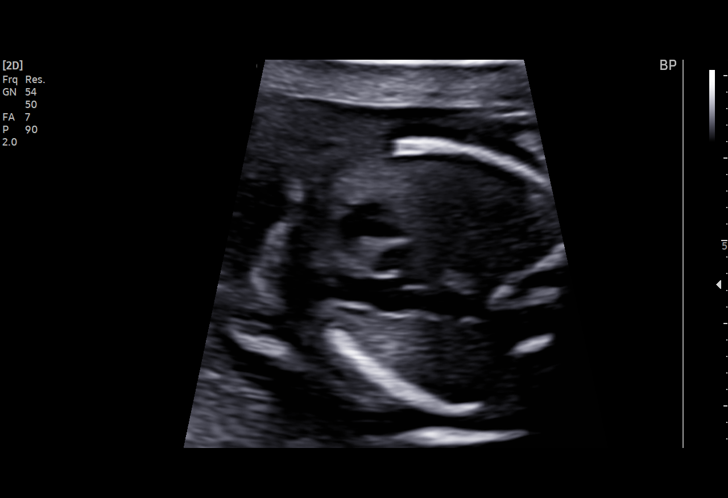
[im 33/125]
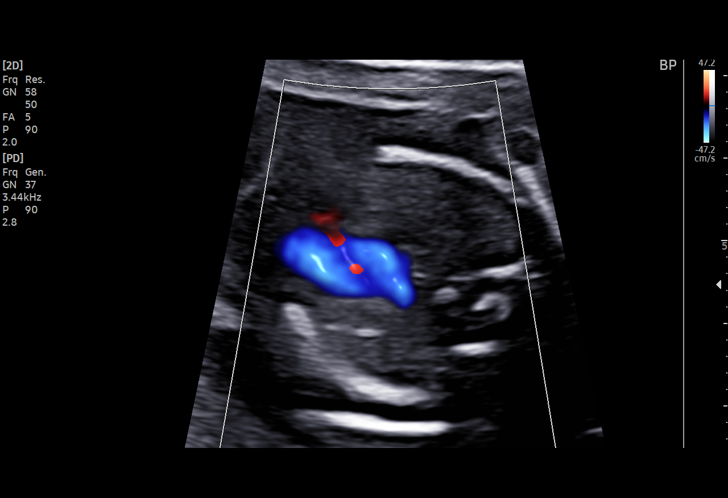
[im 42/125]
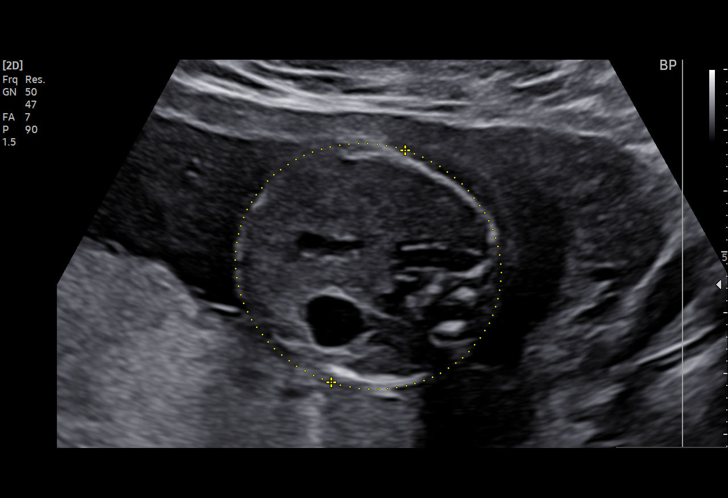
[im 51/125]
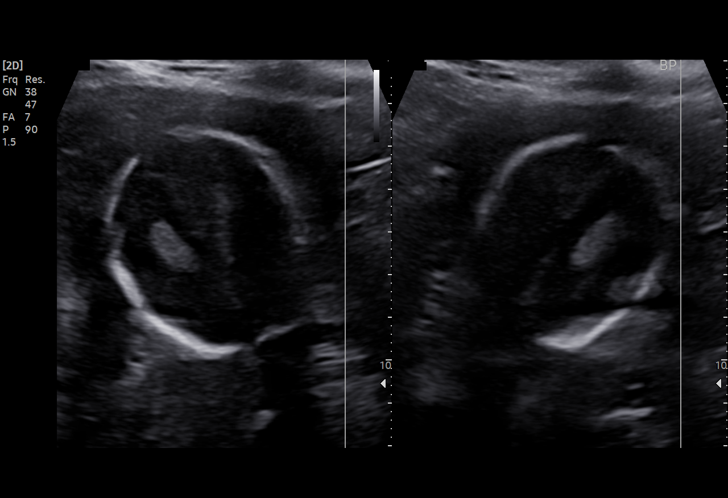
[im 65/125]
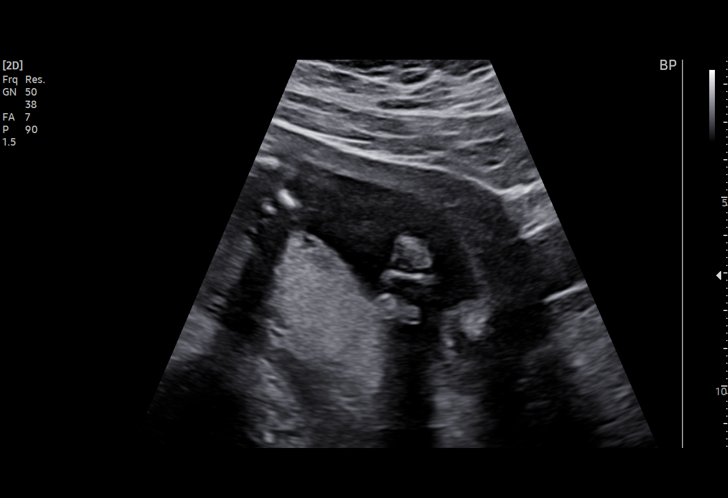
[im 74/125]
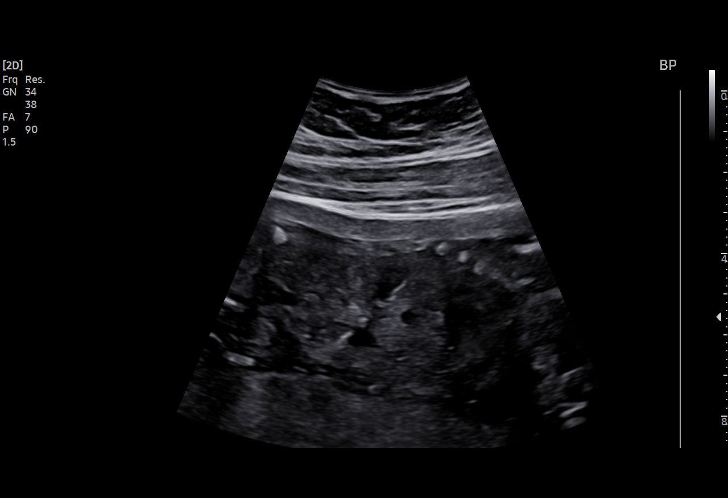
[im 83/125]
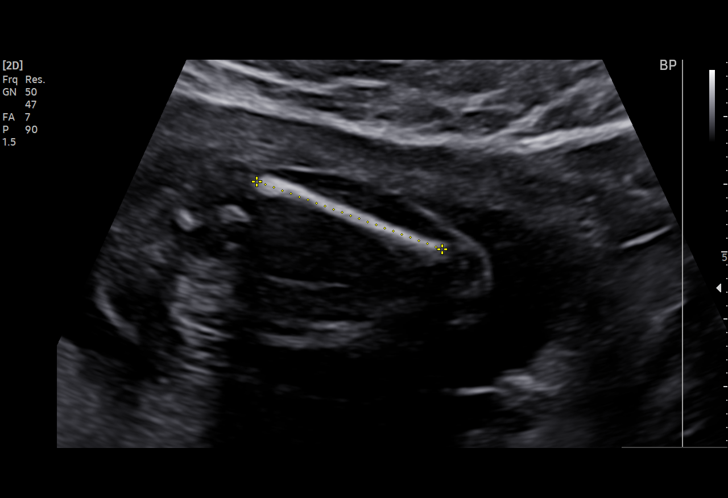
[im 92/125]
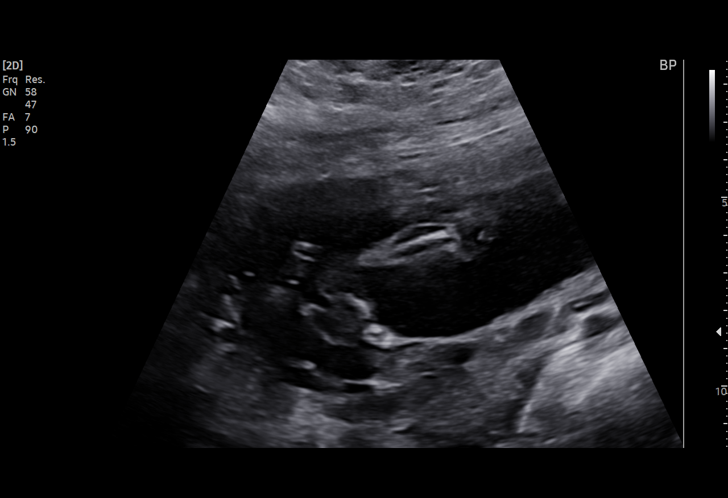
[im 102/125]
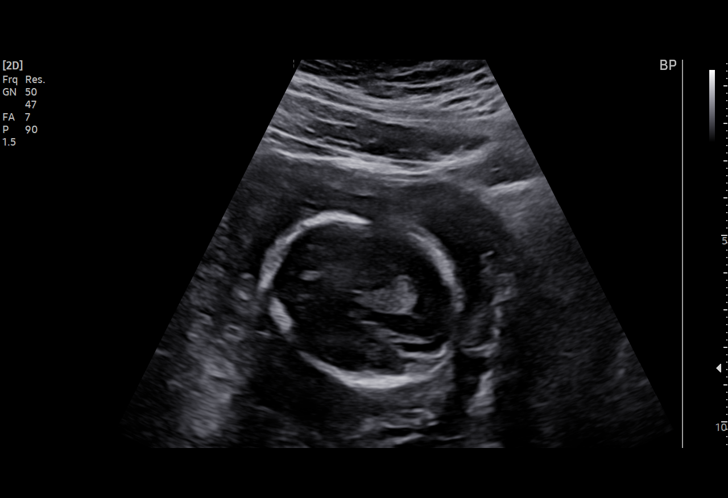
[im 111/125]
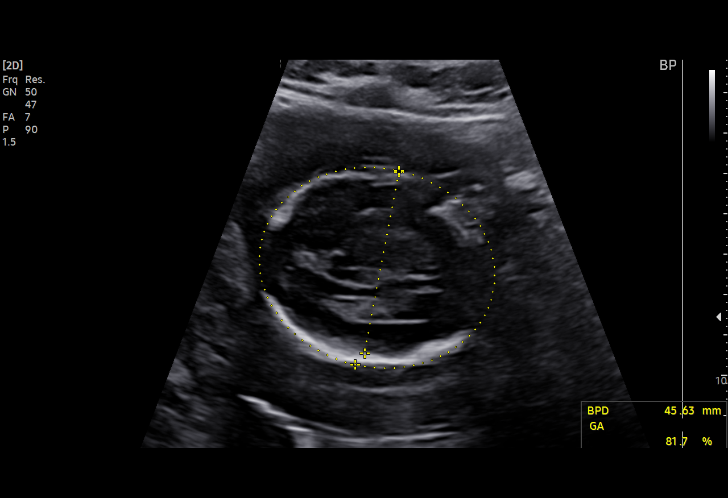
[im 120/125]
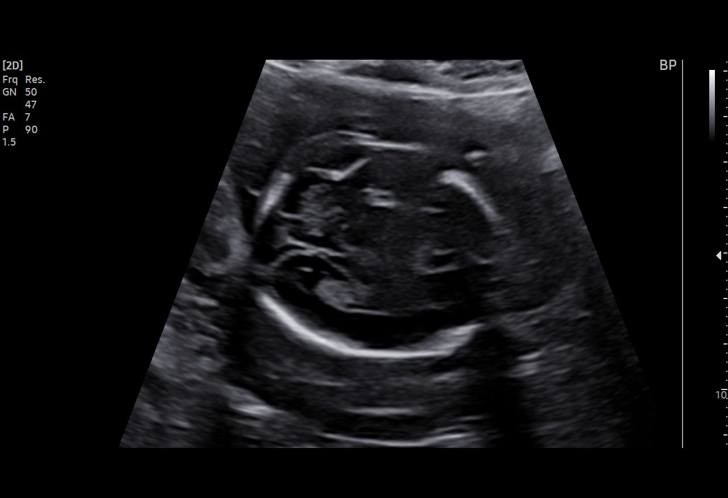

[13 of 28 positions shown; findings below may reference images not displayed]

GENDRON CNM

Indications

 Obesity complicating pregnancy, second
 trimester (BMI 40)
 Negative 7aterni2S9/Negative AFP
 Encounter for antenatal screening for
 malformations
 19 weeks gestation of pregnancy
Vital Signs

 BP:          125/83
Fetal Evaluation

 Num Of Fetuses:         1
 Fetal Heart Rate(bpm):  158
 Cardiac Activity:       Observed
 Presentation:           Cephalic
 Placenta:               Posterior
 P. Cord Insertion:      Visualized, central

 Amniotic Fluid
 AFI FV:      Within normal limits

                             Largest Pocket(cm)

Biometry
 BPD:     44.94  mm     G. Age:  19w 4d         75  %    CI:        73.26   %    70 - 86
                                                         FL/HC:      17.6   %    16.1 -
 HC:    166.86   mm     G. Age:  19w 3d         61  %    HC/AC:      1.25        1.09 -
 AC:    133.01   mm     G. Age:  18w 6d         39  %    FL/BPD:     65.2   %
 FL:      29.31  mm     G. Age:  19w 0d         44  %    FL/AC:      22.0   %    20 - 24
 CER:        19  mm     G. Age:  18w 4d         16  %
 NFT:       4.4  mm

 LV:        4.4  mm
 CM:          5  mm

 Est. FW:     267  gm      0 lb 9 oz     44  %
OB History

 Blood Type:   O+
 Gravidity:    1         Term:   0        Prem:   0        SAB:   0
 TOP:          0       Ectopic:  0        Living: 0
Gestational Age

 LMP:           26w 5d        Date:  02/16/21                  EDD:   11/23/21
 U/S Today:     19w 2d                                        EDD:   01/14/22
 Best:          19w 0d     Det. By:  U/S C R L  (06/13/21)    EDD:   01/16/22
Anatomy

 Cranium:               Appears normal         LVOT:                   Appears normal
 Cavum:                 Appears normal         Aortic Arch:            Appears normal
 Ventricles:            Appears normal         Ductal Arch:            Not well visualized
 Choroid Plexus:        Appears normal         Diaphragm:              Appears normal
 Cerebellum:            Appears normal         Stomach:                Appears normal, left
                                                                       sided
 Posterior Fossa:       Appears normal         Abdomen:                Appears normal
 Nuchal Fold:           Appears normal         Abdominal Wall:         Appears nml (cord
                                                                       insert, abd wall)
 Face:                  Appears normal         Cord Vessels:           Appears normal (3
                        (orbits and profile)                           vessel cord)
 Lips:                  Not well visualized    Kidneys:                Appear normal
 Palate:                Not well visualized    Bladder:                Appears normal
 Thoracic:              Appears normal         Spine:                  Appears normal
 Heart:                 Appears normal         Upper Extremities:      Appears normal
                        (4CH, axis, and
                        situs)
 RVOT:                  Appears normal         Lower Extremities:      Appears normal

 Other:  3VTV visualized. Nasal bone, lenses, maxilla, mandible and falx
         Heels/feet and hands visualized. Parents do not wish to know sex of
         fetus, appears male. Technically difficult due to maternal habitus.
Cervix Uterus Adnexa

 Cervix
 Length:            3.3  cm.
 Normal appearance by transabdominal scan.
 Uterus
 Normal shape and size.

 Right Ovary
 Not visualized.

 Left Ovary
 Size(cm)       3.8  x   2.47   x  2.12      Vol(ml):
 Within normal limits.

 Cul De Sac
 No free fluid seen.

 Adnexa
 No adnexal mass visualized.
Impression

 Single intrauterine pregnancy here for a detailed anatomy
 due to elevated BMI
 Normal anatomy with measurements consistent with dates
 There is good fetal movement and amniotic fluid volume
 Suboptimal views of the fetal anatomy were obtained
 secondary to fetal position.

 (Does not desire gender).
Recommendations

 Follow up growth and anatomy in 4 weeks.
